# Patient Record
Sex: Male | Born: 1950 | Race: White | Hispanic: No | Marital: Married | Smoking: Former smoker
Health system: Southern US, Community
[De-identification: ages and names within clinical notes are randomized; demographics above are authoritative.]

## PROBLEM LIST (undated history)

## (undated) DIAGNOSIS — I493 Ventricular premature depolarization: Secondary | ICD-10-CM

## (undated) DIAGNOSIS — D682 Hereditary deficiency of other clotting factors: Secondary | ICD-10-CM

## (undated) DIAGNOSIS — I82409 Acute embolism and thrombosis of unspecified deep veins of unspecified lower extremity: Secondary | ICD-10-CM

## (undated) DIAGNOSIS — I4891 Unspecified atrial fibrillation: Secondary | ICD-10-CM

## (undated) DIAGNOSIS — K635 Polyp of colon: Secondary | ICD-10-CM

## (undated) DIAGNOSIS — R0602 Shortness of breath: Secondary | ICD-10-CM

## (undated) DIAGNOSIS — K5190 Ulcerative colitis, unspecified, without complications: Secondary | ICD-10-CM

## (undated) DIAGNOSIS — T4145XA Adverse effect of unspecified anesthetic, initial encounter: Secondary | ICD-10-CM

## (undated) DIAGNOSIS — A770 Spotted fever due to Rickettsia rickettsii: Secondary | ICD-10-CM

## (undated) DIAGNOSIS — K5792 Diverticulitis of intestine, part unspecified, without perforation or abscess without bleeding: Secondary | ICD-10-CM

## (undated) HISTORY — DX: Diverticulitis of intestine, part unspecified, without perforation or abscess without bleeding: K57.92

## (undated) HISTORY — PX: INGUINAL HERNIA REPAIR: SUR1180

## (undated) HISTORY — DX: Unspecified atrial fibrillation: I48.91

## (undated) HISTORY — DX: Hereditary deficiency of other clotting factors: D68.2

## (undated) HISTORY — DX: Polyp of colon: K63.5

## (undated) HISTORY — PX: CHOLECYSTECTOMY: SHX55

## (undated) HISTORY — DX: Ventricular premature depolarization: I49.3

## (undated) HISTORY — DX: Spotted fever due to Rickettsia rickettsii: A77.0

## (undated) HISTORY — PX: TONSILLECTOMY: SUR1361

## (undated) HISTORY — DX: Ulcerative colitis, unspecified, without complications: K51.90

## (undated) HISTORY — PX: DG GALL BLADDER: HXRAD326

## (undated) HISTORY — DX: Acute embolism and thrombosis of unspecified deep veins of unspecified lower extremity: I82.409

---

## 2003-05-08 HISTORY — PX: LUMBAR LAMINECTOMY: SHX95

## 2007-03-05 ENCOUNTER — Ambulatory Visit: Payer: Self-pay

## 2007-03-05 ENCOUNTER — Ambulatory Visit: Payer: Self-pay | Admitting: Internal Medicine

## 2007-07-15 ENCOUNTER — Inpatient Hospital Stay (HOSPITAL_COMMUNITY): Admission: AD | Admit: 2007-07-15 | Discharge: 2007-07-17 | Payer: Self-pay | Admitting: Internal Medicine

## 2007-07-15 ENCOUNTER — Ambulatory Visit: Payer: Self-pay | Admitting: Internal Medicine

## 2008-10-29 IMAGING — CT CT HEAD W/O CM
1 series · 16 of 30 positions shown, 20 images · IV contrast (agent unspecified)
Comparison: None.

CLINICAL DATA: Severe headache.
 HEAD CT WITHOUT CONTRAST:
TECHNIQUE: Contiguous axial images were obtained from the base of the skull through the vertex according to standard protocol without contrast.

[Series 2: head routine 4.8 h37s · axial · 0.44mm/px · z∈[+1063,+1218]mm · 16 of 36 slices shown, 20 images]
[im 2/36  brain]
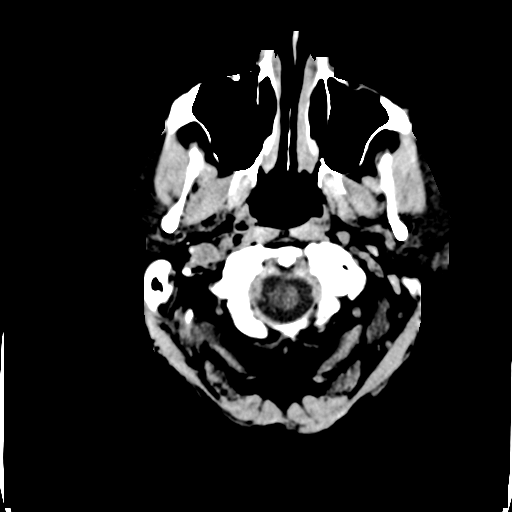
[im 2/36  bone]
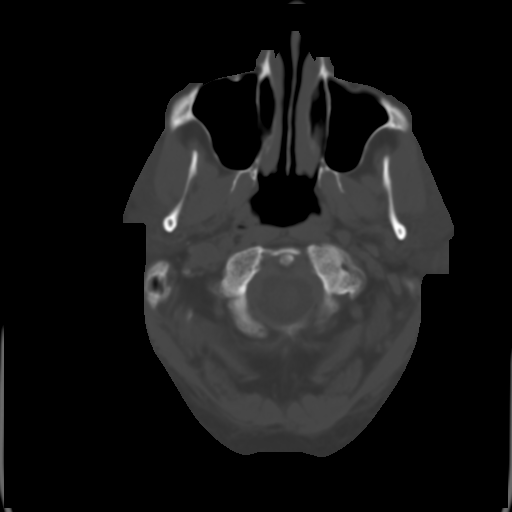
[im 4/36  brain]
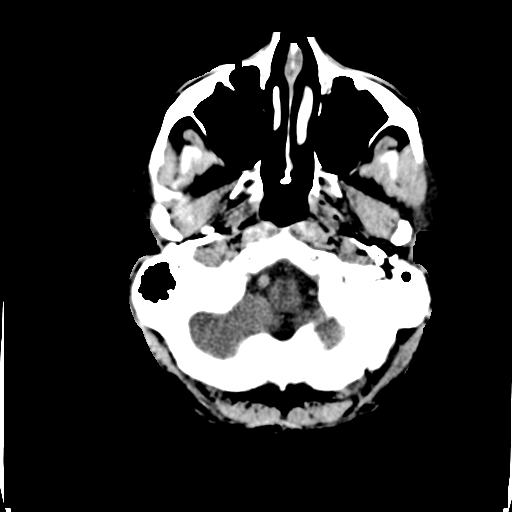
[im 7/36  brain]
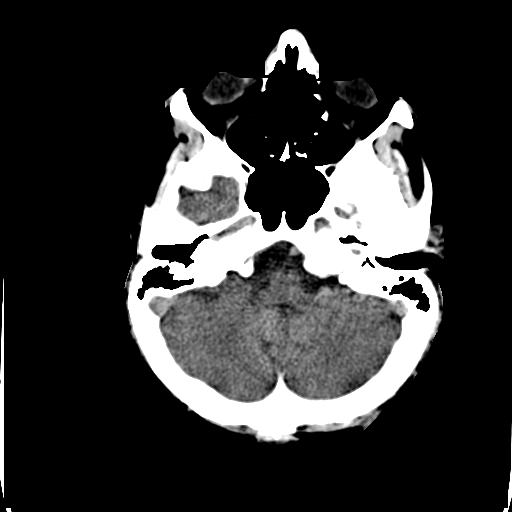
[im 9/36  brain]
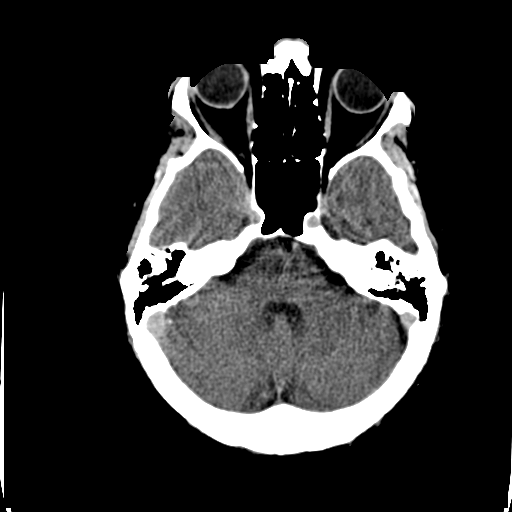
[im 10/36  brain]
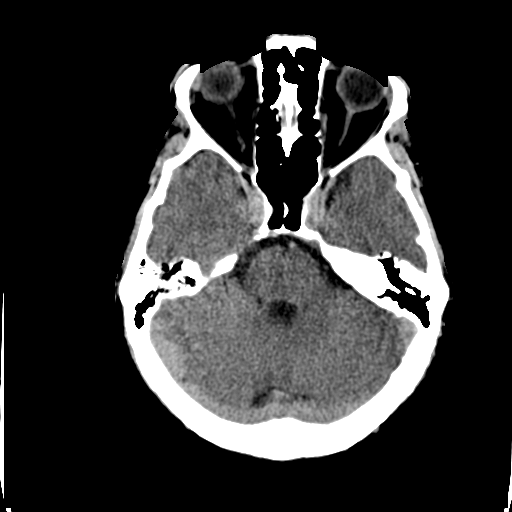
[im 10/36  bone]
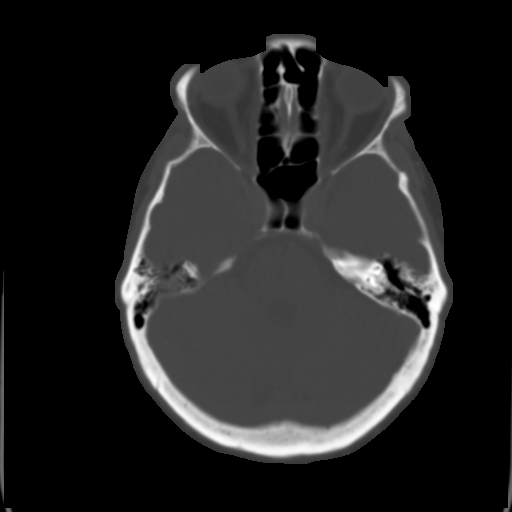
[im 13/36  brain]
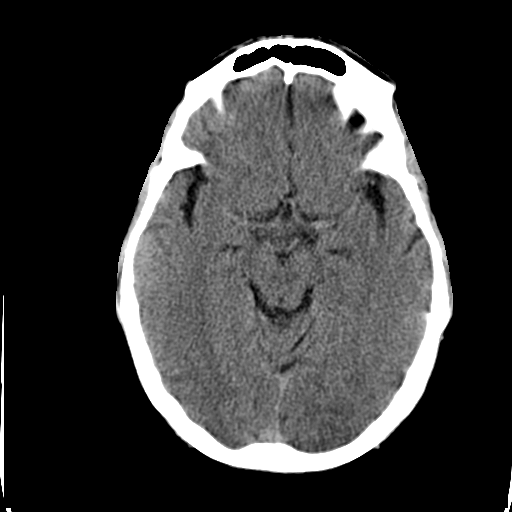
[im 15/36  brain]
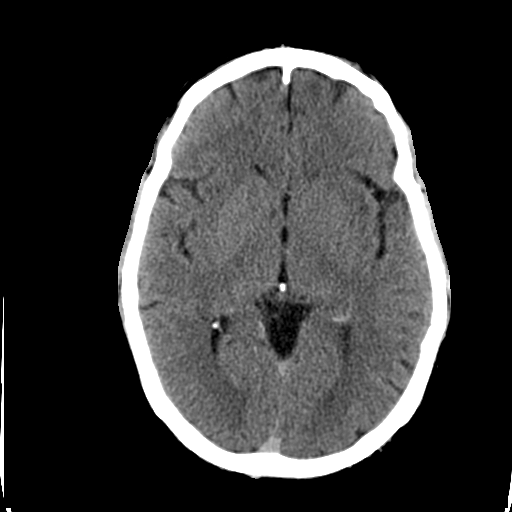
[im 17/36  brain]
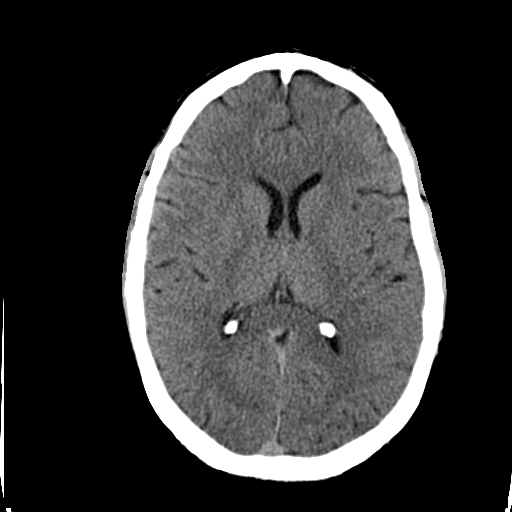
[im 19/36  brain]
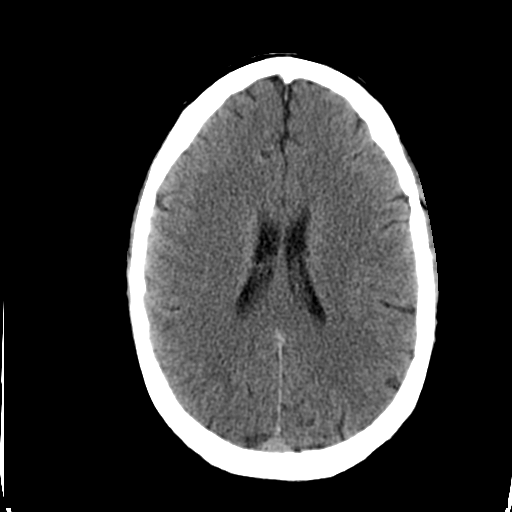
[im 19/36  bone]
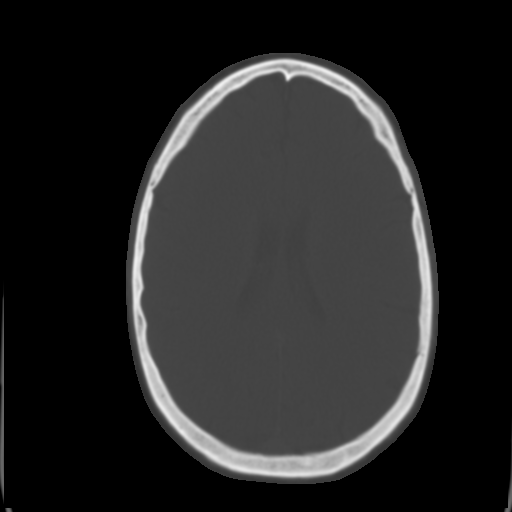
[im 21/36  brain]
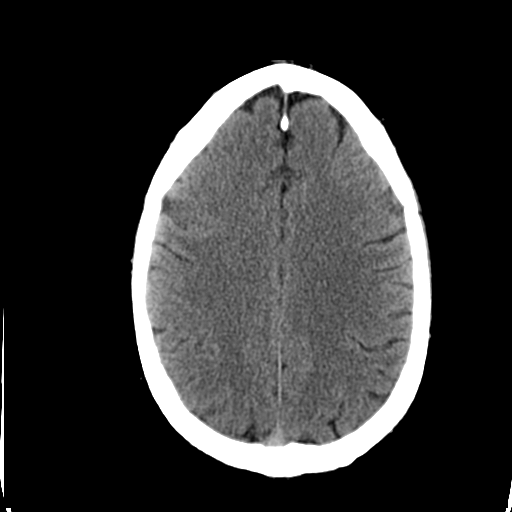
[im 23/36  brain]
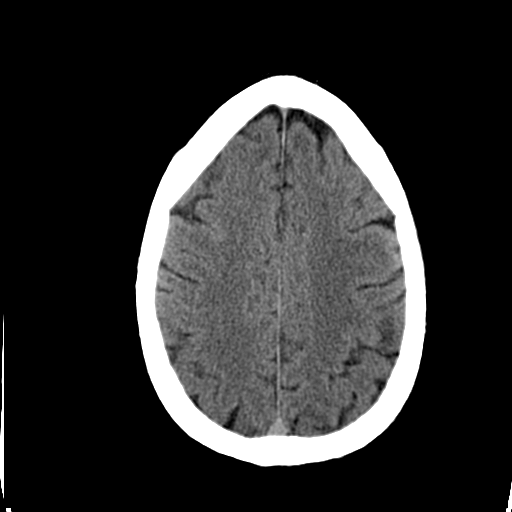
[im 26/36  brain]
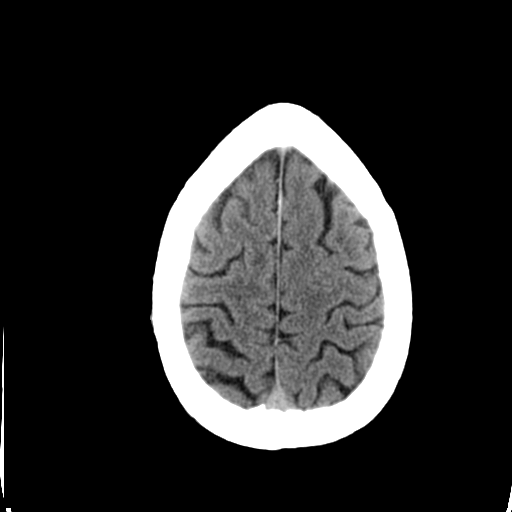
[im 27/36  brain]
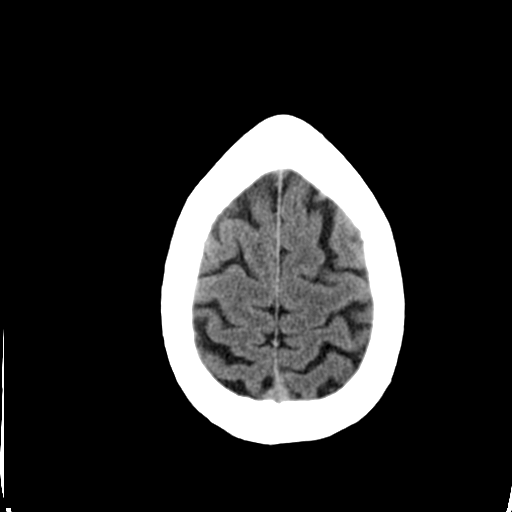
[im 27/36  bone]
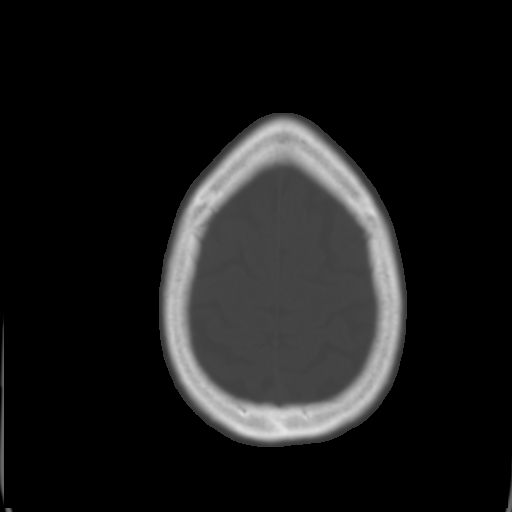
[im 29/36  brain]
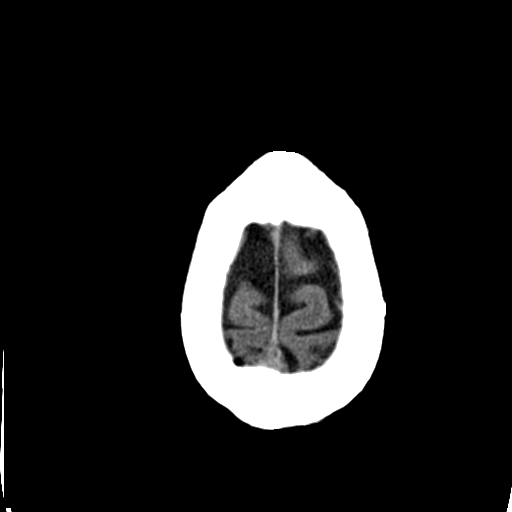
[im 32/36  brain]
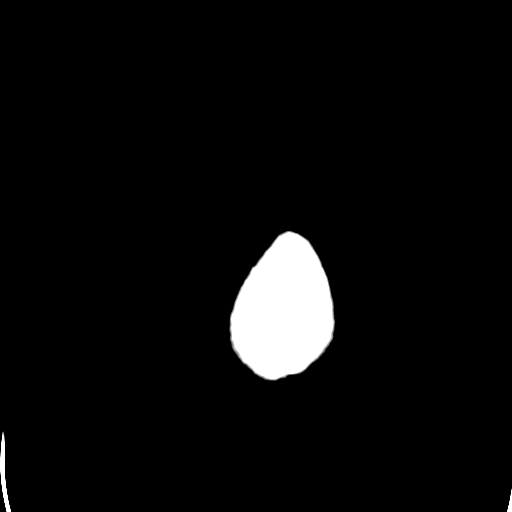
[im 34/36  brain]
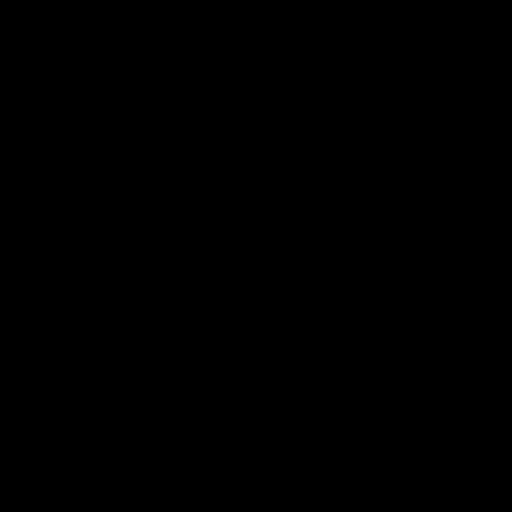

[16 of 30 positions shown; findings below may reference images not displayed]

FINDINGS: The brain has a normal appearance without evidence of atrophy, stroke, mass, hemorrhage, hydrocephalus, or extra-axial collection.  The calvarium is unremarkable.  The sinuses, middle ears, and mastoids are clear.
IMPRESSION: Normal head CT.

## 2009-02-22 ENCOUNTER — Ambulatory Visit: Payer: Self-pay | Admitting: Internal Medicine

## 2009-02-22 DIAGNOSIS — I493 Ventricular premature depolarization: Secondary | ICD-10-CM | POA: Insufficient documentation

## 2009-02-22 DIAGNOSIS — I4891 Unspecified atrial fibrillation: Secondary | ICD-10-CM

## 2009-02-22 DIAGNOSIS — R5383 Other fatigue: Secondary | ICD-10-CM

## 2009-02-22 DIAGNOSIS — R5381 Other malaise: Secondary | ICD-10-CM

## 2009-06-02 ENCOUNTER — Telehealth: Payer: Self-pay | Admitting: Internal Medicine

## 2009-08-02 ENCOUNTER — Ambulatory Visit: Payer: Self-pay | Admitting: Internal Medicine

## 2009-08-02 DIAGNOSIS — R079 Chest pain, unspecified: Secondary | ICD-10-CM

## 2009-08-10 ENCOUNTER — Telehealth (INDEPENDENT_AMBULATORY_CARE_PROVIDER_SITE_OTHER): Payer: Self-pay | Admitting: *Deleted

## 2009-08-11 ENCOUNTER — Encounter (HOSPITAL_COMMUNITY): Admission: RE | Admit: 2009-08-11 | Discharge: 2009-10-05 | Payer: Self-pay | Admitting: Internal Medicine

## 2009-08-11 ENCOUNTER — Ambulatory Visit: Payer: Self-pay

## 2009-08-11 ENCOUNTER — Ambulatory Visit: Payer: Self-pay | Admitting: Cardiology

## 2009-08-15 ENCOUNTER — Telehealth: Payer: Self-pay | Admitting: Internal Medicine

## 2009-08-16 ENCOUNTER — Telehealth: Payer: Self-pay | Admitting: Internal Medicine

## 2009-09-14 ENCOUNTER — Encounter: Payer: Self-pay | Admitting: Internal Medicine

## 2009-09-27 ENCOUNTER — Telehealth: Payer: Self-pay | Admitting: Internal Medicine

## 2010-02-22 ENCOUNTER — Encounter: Payer: Self-pay | Admitting: Internal Medicine

## 2010-02-22 ENCOUNTER — Ambulatory Visit: Payer: Self-pay | Admitting: Internal Medicine

## 2010-03-15 ENCOUNTER — Telehealth: Payer: Self-pay | Admitting: Internal Medicine

## 2010-04-20 ENCOUNTER — Telehealth: Payer: Self-pay | Admitting: Internal Medicine

## 2010-05-03 ENCOUNTER — Encounter (INDEPENDENT_AMBULATORY_CARE_PROVIDER_SITE_OTHER): Payer: Self-pay | Admitting: *Deleted

## 2010-05-10 ENCOUNTER — Telehealth: Payer: Self-pay | Admitting: Internal Medicine

## 2010-06-06 NOTE — Progress Notes (Signed)
Summary: Questions about mdication making him feel funny  Phone Note Call from Patient Call back at [phone]   Caller: Patient Summary of Call: Pt calling regarding the medication and Diltiazem 120 mg it is making the pt feel funny in his head pt want to know if he can go back to taking Metoprolol 50mg . Initial call taken by: Judie Grieve,  August 16, 2009 2:49 PM  Follow-up for Phone Call        Called patient and left message on machine Gypsy Balsam RN BSN  August 16, 2009 4:38 PM  Spoke with patient.  He is not tolerating Cardizem because of symptoms of dizziness and head pressure.  He will stop this med and try Atenolol instead.  Symptoms of sleep apnea do seem to be better of Metoprolol.  Pt will call back in 2 weeks with progress. Gypsy Balsam RN BSN  August 16, 2009 5:03 PM
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
## 2010-06-06 NOTE — Progress Notes (Signed)
Summary: refill meds  Phone Note Refill Request Call back at Home Phone [phone] Message from:  Patient on June 02, 2009 10:12 AM  Refills Requested: Medication #1:  METOPROLOL SUCCINATE 50 MG XR24H-TAB once daily [facility] [place] [phone]   Method Requested: Fax to Mail Away Pharmacy Initial call taken by: Lorne Skeens,  June 02, 2009 10:12 AM  Follow-up for Phone Call       Follow-up by: Judithe Modest CMA,  June 02, 2009 11:21 AM    Prescriptions: METOPROLOL SUCCINATE 50 MG XR24H-TAB (METOPROLOL SUCCINATE) once daily  #30 x 6   Entered by:   Judithe Modest CMA   Authorized by:   Nathen May, MD, [facility]   Signed by:   Judithe Modest CMA on 06/02/2009   Method used:   Electronically to        [facility] [id number] (retail)       [address]       [place], [place]  [address]       Ph: [phone]       Fax: [phone]   RxID:   [id number]

## 2010-06-06 NOTE — Progress Notes (Signed)
Summary: myoview results  Phone Note Outgoing Call Call back at [facility] Phone [phone]   Call placed by: Gypsy Balsam RN BSN,  August 15, 2009 3:07 PM Summary of Call: No answer- attempted to call patient regarding myoview results and to see how symptoms of sleep apnea are with med trials. Gypsy Balsam RN BSN  August 15, 2009 3:08 PM   Follow-up for Phone Call        Pt notified. Gypsy Balsam RN BSN  August 16, 2009 4:49 PM

## 2010-06-06 NOTE — Progress Notes (Signed)
Summary: refill  Phone Note Refill Request Message from:  Patient on Sep 27, 2009 10:23 AM  Refills Requested: Medication #1:  Atenolol 50mg  Sent to [facility] [phone]  Initial call taken by: Judie Grieve,  Sep 27, 2009 10:24 AM  Follow-up for Phone Call       Follow-up by: Judithe Modest CMA,  Sep 27, 2009 11:14 AM    New/Updated Medications: ATENOLOL 50 MG TABS (ATENOLOL) once daily [facility] Prescriptions: ATENOLOL 50 MG TABS (ATENOLOL) once daily [facility]  #30 x 0   Entered by:   Judithe Modest CMA   Authorized by:   Nathen May, MD, [facility]   Signed by:   Judithe Modest CMA on 09/27/2009   Method used:   Electronically to        [facility] [id number] (retail)       [address]       [place], [place]  [address]       Ph: [phone]       Fax: [phone]   RxID:   [id number]

## 2010-06-06 NOTE — Progress Notes (Addendum)
Summary: problem with meds  Phone Note Call from Patient   Caller: Patient [phone] or [phone] Reason for Call: Talk to Nurse Summary of Call: pt was on biostolic and it made his legs hurt, had no energy. so he went back on atenolol and it's making his eyes itchy and red, can he cahnge to something else? uses [facility] [place] Initial call taken by: Glynda Jaeger,  March 15, 2010 9:57 AM  Follow-up for Phone Call        per pt Biostolic made him feel better but legs too heavy, as soon as got off w/in 2 days felt better. The atenolol made his eyes crusty and red and throat hard to swallow. Will take his metoprolol today and then have Dr. Graciela Husbands review.  Follow-up by: Claris Gladden RN,  March 15, 2010 11:10 AM  Additional Follow-up for Phone Call Additional follow up Details #1::        sent in prescription for Pindolol 5mg  per DOD but unable to reach pt. Claris Gladden, RN, BSN 11/9 [phone] adv pt that prescription called in.   Additional Follow-up by: Claris Gladden RN,  March 15, 2010 2:48 PM    New/Updated Medications: PINDOLOL 5 MG TABS (PINDOLOL) once daily Prescriptions: PINDOLOL 5 MG TABS (PINDOLOL) once daily  #30 x 3   Entered by:   Claris Gladden RN   Authorized by:   Nathen May, MD, [facility]   Signed by:   Claris Gladden RN on 03/15/2010   Method used:   Electronically to        [facility] [id number] (retail)       [address]       [place], [place]  [address]       Ph: [phone]       Fax: [phone]   RxID:   [id number]    Allergies: 1)  ! Sulfa   Appended Document: problem with meds d/c pindolol   send RX for  inderal la 60; bisoprolol 2.5, nadolol 20  Appended Document: problem with meds adv pt not to take Pindolol and he will take Metoprolol until he can try the other meds. Claris Gladden, RN, BSN

## 2010-06-06 NOTE — Progress Notes (Signed)
Summary: Nuclear Pre-Procedure  Phone Note Outgoing Call Call back at [facility] Phone [phone]   Call placed by: Stanton Kidney, EMT-P,  August 10, 2009 3:01 PM Call placed to: Patient Action Taken: Phone Call Completed Summary of Call: Reviewed information on Myoview Information Sheet (see scanned document for further details).  Spoke with Patient.    Nuclear Med Background Indications for Stress Test: Evaluation for Ischemia   History: Abnormal EKG, Echo  History Comments: 10/08 Echo: EF=55-60%  Symptoms: Chest Tightness, Fatigue, Palpitations    Nuclear Pre-Procedure Cardiac Risk Factors: Family History - CAD Height (in): 72

## 2010-06-06 NOTE — Assessment & Plan Note (Signed)
Summary: Update med

## 2010-06-06 NOTE — Assessment & Plan Note (Signed)
Summary: PER CHECK OUT/SF   Visit Type:  Follow-up Primary Andera Cranmer:  Dr Ron Parker  CC:  irregular heart beat.  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  Last night he had a disrupted night.  He has not tolerated beta blockers well in the past. Inderal has been sleepy. Metoprolol and wall were both significant fatigue and other side effects. He is currently taking atenolol and he's down titrated his dose from 50-25 with some improvement in his overall fatigue.         Current Medications (verified): 1)  Aspirin 81 Mg Tabs (Aspirin) .Marland Kitchen.. 1 Tab Once Daily 2)  Atenolol 50 Mg Tabs (Atenolol) .... 1/2  Tab  Daily  Allergies (verified): 1)  ! Sulfa  Past History:  Past Medical History: Last updated: 02/22/2009 DVT, lower left leg Diverticulitis ? RMSF Question sepsis associated with diverticulitis  Vital Signs:  Patient profile:   60 year old male Height:      72 inches Weight:      196 pounds Pulse rate:   70 / minute BP sitting:   123 / 77  (left arm) Cuff size:   large  Vitals Entered By: Frank Kanaris, CNA (February 22, 2010 11:11 AM)  Physical Exam  General:  The patient was alert and oriented in no acute distress. HEENT Normal.  Neck veins were flat, carotids were brisk.  Lungs were clear.  Heart sounds were regular without murmurs or gallops.  Abdomen was soft with active bowel sounds. There is no clubbing cyanosis or edema. Skin Warm and dry    Impression & Recommendations:  Problem # 1:  ATRIAL FIBRILLATION (ICD-427.31)  There  has been no recurrent atrial fibrillation; the PACs are certainly a concern. We'll check her thyroid today. The following medications were removed from the medication list:    Atenolol 50 Mg Tabs (Atenolol) ..... Once daily uad His updated medication list for this problem includes:    Aspirin 81 Mg Tabs (Aspirin) .Marland Kitchen... 1 tab once daily    Bystolic 5 Mg Tabs  (Nebivolol hcl) ..... Once 1/2 pill daily  Orders: TLB-TSH (Thyroid Stimulating Hormone) (84443-TSH)  Problem # 2:  FATIGUE (ICD-780.79) this may be related to his atenolol. We will plan to try him on diastolic samples of which he received from Dr. Clarene Duke. He will let us know Orders: TLB-TSH (Thyroid Stimulating Hormone) (84443-TSH)  Problem # 3:  PREMATURE VENTRICULAR CONTRACTIONS (ICD-427.69) his palpitations last night may well have been PVCs again. He thinks that they were different. The following medications were removed from the medication list:    Atenolol 50 Mg Tabs (Atenolol) ..... Once daily uad His updated medication list for this problem includes:    Aspirin 81 Mg Tabs (Aspirin) .Marland Kitchen... 1 tab once daily    Bystolic 5 Mg Tabs (Nebivolol hcl) ..... Once 1/2 pill daily  Patient Instructions: 1)  Your physician has recommended you make the following change in your medication: Discontinue Atenolol and start Bystolic samples that you have.  2)  Your physician recommends that you have TSH lab work today.  3)  Your physician wants you to follow-up in: 6 months   You will receive a reminder letter in the mail two months in advance. If you don't receive a letter, please call our office to schedule the follow-up appointment.

## 2010-06-06 NOTE — Assessment & Plan Note (Signed)
Summary: Cardiology Nuclear Study  Nuclear Med Background Indications for Stress Test: Evaluation for Ischemia   History: Abnormal EKG, Echo  History Comments: 10/08 Echo: EF=55-60%  Symptoms: Chest Tightness, Fatigue, Palpitations, SOB    Nuclear Pre-Procedure Cardiac Risk Factors: Family History - CAD Caffeine/Decaff Intake: None NPO After: 6:00 PM Lungs: clear IV 0.9% NS with Angio Cath: 22g     IV Site: (R) AC IV Started by: Irean Hong RN Chest Size (in) 44     Height (in): 72 Weight (lb): 195 BMI: 26.54  Nuclear Med Study 1 or 2 day study:  1 day     Stress Test Type:  Eugenie Birks Reading MD:  Marca Ancona, MD     Referring MD:  S.Klein Resting Radionuclide:  Technetium 28m Tetrofosmin     Resting Radionuclide Dose:  11 mCi  Stress Radionuclide:  Technetium 83m Tetrofosmin     Stress Radionuclide Dose:  33 mCi   Stress Protocol   Lexiscan: 0.4 mg   Stress Test Technologist:  Milana Na EMT-P     Nuclear Technologist:  Domenic Polite CNMT  Rest Procedure  Myocardial perfusion imaging was performed at rest 45 minutes following the intravenous administration of Myoview Technetium 46m Tetrofosmin.  Stress Procedure  The patient received IV Lexiscan 0.4 mg over 15-seconds.  Myoview injected at 30-seconds.  There were no significant changes with infusion.  Quantitative spect images were obtained after a 45 minute delay.  QPS Raw Data Images:  Normal; no motion artifact; normal heart/lung ratio. Stress Images:  NI: Uniform and normal uptake of tracer in all myocardial segments. Rest Images:  Uniform and normal uptake of tracer in all myocardial segments. Subtraction (SDS):  There is no evidence of scar or ischemia. Transient Ischemic Dilatation:  101  (Normal <1.22)  Lung/Heart Ratio:  .25  (Normal <0.45)  Quantitative Gated Spect Images QGS EDV:  92 ml QGS ESV:  33 ml QGS EF:  64 % QGS cine images:  Normal wall motion.    Overall  Impression  Exercise Capacity: Lexiscan study.  BP Response: Normal blood pressure response. Clinical Symptoms: Felt "weird" ECG Impression: No significant ST segment change suggestive of ischemia. Overall Impression: Normal stress nuclear study.  Appended Document: Cardiology Nuclear Study plz inform patinent

## 2010-06-08 NOTE — Letter (Signed)
Summary: Generic Letter  Architectural technologist, Main Office  [address]   [place], [place] [address]   Phone: [phone]  Fax: [phone]        May 03, 2010 MRN: [id number]    [facility] [address] [place], [place]  [address]    Dear Frank Clark,  I have been trying to contact you regarding your refill request for Atenolol and Propranolol. It was our understanding that you had discontinued Atenolol and were trying Propranolol as one of the beta blocker options.  These medications were not prescribed to be taken together.    Please call our office to discuss which blood pressure medication you are currently taking on a regular basis.      Sincerely,  Frank Gladden RN  This letter has been electronically signed by your physician.

## 2010-06-08 NOTE — Progress Notes (Signed)
Summary: refill meds  Phone Note Refill Request Call back at Home Phone [phone] Message from:  Patient on April 20, 2010 12:13 PM  inderal la 60 mg. [facility] [place]. [phone].   Method Requested: Fax to Mail Away Pharmacy Initial call taken by: Lorne Skeens,  April 20, 2010 12:14 PM  Follow-up for Phone Call        left msg for pt to cb.Claris Gladden, RN, BSN 12/20 1334 rna at pt home number. pt was given prescrip of different beta blockers to try. see that he is taking 2 beta blockers.  will discuss w/pt when he calls back. Follow-up by: Claris Gladden RN,  April 25, 2010 5:09 PM  Additional Follow-up for Phone Call Additional follow up Details #1::        rna at pt home number. Claris Gladden, RN, BSN 12/21 [phone] 12/22 1855 rna at pt house. Claris Gladden, RN, BSN 12/28 [phone] line busy. Claris Gladden, RN, BSN 12/28 12:00 pm line busy. Claris Gladden, RN, BSN 12/28 1325 pm line busy. Claris Gladden, RN, BSN letter sent to pt for clarification. Claris Gladden, RN, BSN Additional Follow-up by: Claris Gladden RN,  May 03, 2010 1:35 PM

## 2010-06-08 NOTE — Progress Notes (Signed)
Summary: rx refill PROPRANOLOL ER 60MG  CAP  Phone Note Call from Patient Call back at Home Phone [phone]   Caller: Patient Reason for Call: Talk to Nurse Summary of Call: pt has question re PROPRANOLOL ER 60MG  CAP. pt needs this to be calle din to [facility] [phone] Initial call taken by: Roe Coombs,  May 10, 2010 3:00 PM  Follow-up for Phone Call        rna pt house. prescription called in. Claris Gladden, RN, BSN 05/11/10 1002 05/11/10 1225 rna Claris Gladden, RN, BSN    New/Updated Medications: PROPRANOLOL HCL CR 60 MG XR24H-CAP (PROPRANOLOL HCL) once daily Prescriptions: PROPRANOLOL HCL CR 60 MG XR24H-CAP (PROPRANOLOL HCL) once daily  #90 x 3   Entered by:   Claris Gladden RN   Authorized by:   Nathen May, MD, [facility]   Signed by:   Claris Gladden RN on 05/11/2010   Method used:   Electronically to        [facility] [id number] (retail)       [address]       [place], [place]  [address]       Ph: [phone]       Fax: [phone]   RxID:   [id number]
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
## 2010-09-28 ENCOUNTER — Encounter: Payer: Self-pay | Admitting: Internal Medicine

## 2010-10-09 ENCOUNTER — Encounter: Payer: Self-pay | Admitting: Internal Medicine

## 2010-10-09 ENCOUNTER — Ambulatory Visit (INDEPENDENT_AMBULATORY_CARE_PROVIDER_SITE_OTHER): Payer: Self-pay | Admitting: Internal Medicine

## 2010-10-09 DIAGNOSIS — I472 Ventricular tachycardia: Secondary | ICD-10-CM

## 2010-10-09 DIAGNOSIS — I428 Other cardiomyopathies: Secondary | ICD-10-CM

## 2010-10-09 NOTE — Patient Instructions (Signed)
Your physician has recommended you make the following change in your medication:  1) Hold propranolol (inderal) 2) Take a 1/2 a tablet a day of the bystolic dose (samples) that you have at home.  Your physician wants you to follow-up in: 4 months. You will receive a reminder letter in the mail two months in advance. If you don't receive a letter, please call our office to schedule the follow-up appointment.  Please call Dr. Odessa Fleming nurse, Sherri Rad, in 4 weeks to let us know how you are feeling off the propranolol and on the bystolic.

## 2010-10-09 NOTE — Progress Notes (Signed)
See dictated note.

## 2010-10-09 NOTE — Assessment & Plan Note (Signed)
[facility]                        ELECTROPHYSIOLOGY OFFICE NOTE  Frank Clark, Frank Clark                      MRN:          [id number] DATE:10/09/2010                            DOB:          September 03, 1950   The patient was seen in followup for paroxysmal atrial fibrillation and PVCs.  We have tried him on a variety of beta-blockers.  He is currently taking Inderal.  This has had a moderately successful impact on the palpitations.  It is associated with some fatigue although less than he had been with other medications.  He finds that it slowed his metabolism and that he is gaining weight.  He is walking 3 miles a day.  He has had some problem with chest discomfort also, this is a vague discomfort and is not exertional.  It comes episodically.  It can last 5- 30 minutes at a time.  It is not aggravated by his walking and it sometimes relieves even while he continues his walking.  PHYSICAL EXAMINATION:  His blood pressure today was 120/76, his pulse was 70.  His lungs were clear.  Neck veins were flat.  Heart sounds were regular without murmurs or gallops.  The abdomen was soft.  Extremities were without edema.  His skin was warm and dry.  Electrocardiogram dated today demonstrated sinus rhythm at 70 with intervals of 0.14/0.08/0.37.  The axis was leftward and -30.  There is a suggestion of inferior Q-waves.  IMPRESSION: 1. Fatigue, questionably related to his beta-blocker. 2. Premature ventricular contractions. 3. Paroxysmal atrial fibrillation. 4. Chest pain - atypical.  We will plan to try him on his Bystolic for which he has samples at home.  He will let us know over the next 3 or 4 weeks how it is that he is feeling with this.  I think his chest pain is sufficiently atypical that pursuing further at this point, it is not something we will do.  I will need to check his old chart to see if he has had a stress test in the past which I think he  has, but unfortunately right now I am not able to get into the computer system.    Duke Salvia, MD, [facility]    SCK/MedQ  DD: 10/09/2010  DT: 10/09/2010  Job #: [id number]  cc:   Kari Baars, M.D.

## 2011-01-29 LAB — PROTIME-INR
INR: 0.9
INR: 1

## 2011-01-29 LAB — ANTIPHOSPHOLIPID SYNDROME EVAL, BLD
Anticardiolipin IgA: <7 — ABNORMAL LOW (ref <13)
Anticardiolipin IgG: 9 — ABNORMAL LOW (ref <11)
Anticardiolipin IgM: <7 — ABNORMAL LOW (ref <10)
Antiphosphatidylserine IgA: <20 APS U/mL (ref <20.0)
Antiphosphatidylserine IgM: <25 MPS U/mL (ref <25.0)
PTT Lupus Anticoagulant: 42 (ref 36.3–48.8)

## 2011-01-29 LAB — COMPREHENSIVE METABOLIC PANEL
ALT: 46
AST: 24
Alkaline Phosphatase: 75
CO2: 26
Chloride: 105
GFR calc Af Amer: >60
GFR calc non Af Amer: >60
Glucose, Bld: 95
Potassium: 3.7
Sodium: 140

## 2011-01-29 LAB — CBC
HCT: 44.8
HCT: 50.2
Hemoglobin: 15.6
Hemoglobin: 15.8
MCHC: 34.8
MCV: 90.4
Platelets: 163
RBC: 4.97
RBC: 5.12
RDW: 12.4
RDW: 12.5
WBC: 10.1
WBC: 11.3 — ABNORMAL HIGH

## 2011-01-29 LAB — FACTOR 5 LEIDEN

## 2011-01-29 LAB — CARDIOLIPIN ANTIBODIES, IGG, IGM, IGA
Anticardiolipin IgA: <7 — ABNORMAL LOW (ref <13)
Anticardiolipin IgG: 7 — ABNORMAL LOW (ref <11)

## 2011-01-29 LAB — LUPUS ANTICOAGULANT PANEL: PTTLA Confirmation: 2.4 (ref <8.0)

## 2011-01-29 LAB — BETA-2-GLYCOPROTEIN I ABS, IGG/M/A
Beta-2 Glyco I IgG: 5 U/mL (ref <20)
Beta-2-Glycoprotein I IgA: <4 U/mL (ref <10)

## 2011-01-29 LAB — PROTEIN C ACTIVITY: Protein C Activity: 152 % — ABNORMAL HIGH (ref 75–133)

## 2011-01-29 LAB — PROTEIN S, TOTAL: Protein S Ag, Total: 176 % — ABNORMAL HIGH (ref 70–140)

## 2011-01-29 LAB — PROTEIN C, TOTAL: Protein C, Total: 105 % (ref 70–140)

## 2011-01-29 LAB — PROTHROMBIN GENE MUTATION

## 2011-01-29 LAB — ANTITHROMBIN III: AntiThromb III Func: 121 — ABNORMAL HIGH (ref 76–126)

## 2011-02-08 ENCOUNTER — Ambulatory Visit (INDEPENDENT_AMBULATORY_CARE_PROVIDER_SITE_OTHER): Payer: BC Managed Care – PPO | Admitting: Internal Medicine

## 2011-02-08 ENCOUNTER — Encounter: Payer: Self-pay | Admitting: Internal Medicine

## 2011-02-08 DIAGNOSIS — R079 Chest pain, unspecified: Secondary | ICD-10-CM

## 2011-02-08 DIAGNOSIS — D6851 Activated protein C resistance: Secondary | ICD-10-CM | POA: Insufficient documentation

## 2011-02-08 DIAGNOSIS — D6859 Other primary thrombophilia: Secondary | ICD-10-CM

## 2011-02-08 DIAGNOSIS — I4949 Other premature depolarization: Secondary | ICD-10-CM

## 2011-02-08 MED ORDER — NEBIVOLOL HCL 5 MG PO TABS: 5.0000 mg | ORAL_TABLET | Freq: Every day | ORAL | Status: DC

## 2011-02-08 NOTE — Assessment & Plan Note (Signed)
Currently controlled  - continue current medications.  

## 2011-02-08 NOTE — Progress Notes (Signed)
  HPI  Frank Clark is a 60 y.o. male for paroxysmal atrial fibrillation and  PVCs. We have tried him on a variety of beta-blockers. He is currently taking systolic which has been the best for him. He's had just infrequent PVCs. There is not severe lethargy on the 5 mg dosing.  He continues with some atypical chest pains. Myoview scan in April 2011 with normal.  He tells me he has factor V Leiden deficiency. He has a history of a clot in his leg. He was told that he could stop his anticoagulation after 6 months.  He is scheduled for gallbladder surgery. He should be acceptable risk he should take his Bystolic  Past Medical History  Diagnosis Date  . DVT, lower extremity     left  . Diverticulitis   . RMSF [facility] spotted fever)     Past Surgical History  Procedure Date  . Lumbar laminectomy 2005  . Inguinal hernia repair     left  . Tonsillectomy     Current Outpatient Prescriptions  Medication Sig Dispense Refill  . aspirin 81 MG tablet Take 81 mg by mouth daily.        . nebivolol (BYSTOLIC) 5 MG tablet Take 5 mg by mouth daily.        Marland Kitchen omeprazole (PRILOSEC) 10 MG capsule Take 10 mg by mouth as needed.        . traMADol (ULTRAM) 50 MG tablet         Allergies  Allergen Reactions  . Sulfonamide Derivatives     REACTION: Reaction not known    Review of Systems negative except from HPI and PMH  Physical Exam Well developed and well nourished in no acute distress HENT normal E scleral and icterus clear Neck Supple Clear to ausculation Regular rate and rhythm, no murmurs gallops or rub Soft with active bowel sounds No clubbing cyanosis and edema Alert and oriented, grossly normal motor and sensory function Skin Warm and Dry   Assessment and  Plan

## 2011-02-08 NOTE — Patient Instructions (Signed)
Your physician wants you to follow-up in:  YEAR WITH DR KLEIN  You will receive a reminder letter in the mail two months in advance. If you don't receive a letter, please call our office to schedule the follow-up appointment. Your physician recommends that you continue on your current medications as directed. Please refer to the Current Medication list given to you today. 

## 2011-02-08 NOTE — Assessment & Plan Note (Signed)
Probably noncardiac with negative Myoview year ago

## 2011-02-08 NOTE — Assessment & Plan Note (Signed)
I will review the recommendations for long-term anticoagulation in a patient with a known deficiency and prior clot

## 2011-02-24 DIAGNOSIS — T8859XA Other complications of anesthesia, initial encounter: Secondary | ICD-10-CM

## 2011-02-24 HISTORY — DX: Other complications of anesthesia, initial encounter: T88.59XA

## 2011-04-19 ENCOUNTER — Ambulatory Visit (INDEPENDENT_AMBULATORY_CARE_PROVIDER_SITE_OTHER): Payer: BC Managed Care – PPO | Admitting: Internal Medicine

## 2011-04-19 ENCOUNTER — Telehealth: Payer: Self-pay | Admitting: Internal Medicine

## 2011-04-19 ENCOUNTER — Encounter: Payer: Self-pay | Admitting: *Deleted

## 2011-04-19 DIAGNOSIS — I4891 Unspecified atrial fibrillation: Secondary | ICD-10-CM

## 2011-04-19 DIAGNOSIS — Z01810 Encounter for preprocedural cardiovascular examination: Secondary | ICD-10-CM

## 2011-04-19 LAB — CBC WITH DIFFERENTIAL/PLATELET
Eosinophils Relative: 0.3 % (ref 0.0–5.0)
MCV: 93.3 fl (ref 78.0–100.0)
Monocytes Absolute: 1.3 10*3/uL — ABNORMAL HIGH (ref 0.1–1.0)
Neutrophils Relative %: 80.2 % — ABNORMAL HIGH (ref 43.0–77.0)
Platelets: 215 10*3/uL (ref 150.0–400.0)
WBC: 17.4 10*3/uL — ABNORMAL HIGH (ref 4.5–10.5)

## 2011-04-19 LAB — BASIC METABOLIC PANEL
Calcium: 9 mg/dL (ref 8.4–10.5)
Chloride: 102 mEq/L (ref 96–112)
Creatinine, Ser: 1.2 mg/dL (ref 0.4–1.5)
GFR: 66.46 mL/min (ref >60.00)

## 2011-04-19 LAB — PROTIME-INR
INR: 1 ratio (ref 0.8–1.0)
Prothrombin Time: 10.8 s (ref 10.2–12.4)

## 2011-04-19 MED ORDER — RIVAROXABAN 20 MG PO TABS: 20.0000 mg | ORAL_TABLET | Freq: Every day | ORAL | Status: DC

## 2011-04-19 MED ORDER — NEBIVOLOL HCL 5 MG PO TABS: 5.0000 mg | ORAL_TABLET | Freq: Two times a day (BID) | ORAL | Status: DC

## 2011-04-19 NOTE — Telephone Encounter (Signed)
Per spouse pt heart is out of rhythm and he needs to be seen today and pt is having chest pain with exertion and she is very concerned

## 2011-04-19 NOTE — Patient Instructions (Signed)
Your physician has recommended you make the following change in your medication: STOP ASPIRIN START XARELTO 20 MG 1 EVERY DAY AND INCREASE BYSTOLIC TO 5 MG  TWICE DAILY  Your physician has requested that you have a TEE/Cardioversion. During a TEE, sound waves are used to create images of your heart. It provides your doctor with information about the size and shape of your heart and how well your heart's chambers and valves are working. In this test, a transducer is attached to the end of a flexible tube that is guided down you throat and into your esophagus (the tube leading from your mouth to your stomach) to get a more detailed image of your heart. Once the TEE has determined that a blood clot is not present, the cardioversion begins. Electrical Cardioversion uses a jolt of electricity to your heart either through paddles or wired patches attached to your chest. This is a controlled, usually prescheduled, procedure. This procedure is done at the hospital and you are not awake during the procedure. You usually go home the day of the procedure. Please see the instruction sheet given to you today for more information. MON 04/23/11  WITH DR [facility]

## 2011-04-19 NOTE — Progress Notes (Signed)
  HPI  Frank Clark is a 60 y.o. male Seen at the request of his wife because of shortness of breath and dyspnea on exertion. About 2 weeks ago he developed a sinus infection with drainage and a sore throat. He noted earlier this week that he had dyspnea on exertion and he felt" like his PVCs" were going crazy.  He has also been quite fatigued.  He has a history of paroxysmal atrial fibrillation and PVCs. We have tried him on a variety of beta-blockers. He is currently taking systolic which has been the best for him. He's had just infrequent PVCs. There is not severe lethargy on the 5 mg dosing.   Myoview scan in April 2011 with normal.  He tells me he has factor V Leiden deficiency. He has a history of a clot in his leg. He was told that he could stop his anticoagulation after 6 months.   Past Medical History  Diagnosis Date  . DVT, lower extremity     left  . Diverticulitis   . 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 PVC (premature ventricular contraction)   . Atrial fibrillation     assoc with diverticulitis and RMSF  . Factor V deficiency     Past Surgical History  Procedure Date  . Lumbar laminectomy 2005  . Inguinal hernia repair     left  . Tonsillectomy     Current Outpatient Prescriptions  Medication Sig Dispense Refill  . aspirin 81 MG tablet Take 81 mg by mouth daily.        . nebivolol (BYSTOLIC) 5 MG tablet Take 1 tablet (5 mg total) by mouth daily.  30 tablet  11  . omeprazole (PRILOSEC) 10 MG capsule Take 10 mg by mouth as needed.        . traMADol (ULTRAM) 50 MG tablet         Allergies  Allergen Reactions  . Sulfonamide Derivatives     REACTION: Reaction not known    Review of Systems negative except from HPI and PMH  Physical Exam Well developed and well nourished in no acute distress HENT normal E scleral and icterus clear Neck Supple JVP flat; carotids brisk and full Clear to ausculation irregularly irregular rate and rhythm, 2/6 early  systolic murmurs Soft with active bowel sounds No clubbing cyanosis none Edema Alert and oriented, grossly normal motor and sensory function Skin Warm and Dry  Atrial fibrillation with a ventricular response of 90 beats per minute  Assessment and  Plan  

## 2011-04-19 NOTE — Telephone Encounter (Signed)
[id number] Spoke to wife about pt complaints of heart being out of rhythm and chest pain. The pt has been treated per Dr Veatrice Kells for cough/bronchitis type illness since Nov 30th. He has had kenalog injections, 2 shots of Rocephin, finished prednisone taper this past Monday. Also completed 10 day course of Levaquin. The pt is feeling better in regards to his bronchitis but is now having intermittent episodes of chest pain and irregular heart beat that have been going on since Monday. When he has the episodes he is very weak. Wife states the pt needs to be seen today and his schedule is wide open. Advised will discuss the pt with Dr Graciela Husbands and return a call to her.  [id number] Discussed pt with Dr Graciela Husbands he advised to have the pt come at 1130 today.  [id number] Wife notified that pt will be seen by Dr Graciela Husbands today at 1130 today.  [id number] Spoke to the pt he is not having chest pain at present but can still feel "PVC's". Advised pt of appt today at 1130am pt states he will be here.

## 2011-04-19 NOTE — Assessment & Plan Note (Addendum)
The patient has atrial fibrillation of at least 4 days duration probably longer. As such she'll need to be anticoagulated with TE guided cardioversion. He is quite symptomatic and so we'll increase his bisoprolol between now and his cardioversion. We have discussed anticoagulation and will put him on Rivaroxaban;  We will stop his aspirin  I reviewed the potential risks and benefits with him and I discussed this also with his wife

## 2011-04-20 ENCOUNTER — Telehealth: Payer: Self-pay | Admitting: Internal Medicine

## 2011-04-20 NOTE — Telephone Encounter (Signed)
New Msg: Pt calling wanting to know results of pt lab work. Please return pt call to discuss further.

## 2011-04-20 NOTE — Telephone Encounter (Signed)
I spoke with the patient and his wife. 

## 2011-04-23 ENCOUNTER — Encounter (HOSPITAL_COMMUNITY): Payer: Self-pay | Admitting: Anesthesiology

## 2011-04-23 ENCOUNTER — Ambulatory Visit (HOSPITAL_COMMUNITY)
Admission: RE | Admit: 2011-04-23 | Discharge: 2011-04-23 | Disposition: A | Discharge status: Home / Self Care | Payer: BC Managed Care – PPO | Source: Ambulatory Visit | Attending: Cardiology | Admitting: Cardiology

## 2011-04-23 ENCOUNTER — Encounter (HOSPITAL_COMMUNITY)
Admission: RE | Disposition: A | Discharge status: Home / Self Care | Payer: Self-pay | Source: Ambulatory Visit | Attending: Cardiology

## 2011-04-23 ENCOUNTER — Encounter (HOSPITAL_COMMUNITY): Payer: Self-pay | Admitting: *Deleted

## 2011-04-23 ENCOUNTER — Ambulatory Visit (HOSPITAL_COMMUNITY): Payer: BC Managed Care – PPO | Admitting: Anesthesiology

## 2011-04-23 DIAGNOSIS — Z86718 Personal history of other venous thrombosis and embolism: Secondary | ICD-10-CM | POA: Insufficient documentation

## 2011-04-23 DIAGNOSIS — I4892 Unspecified atrial flutter: Secondary | ICD-10-CM

## 2011-04-23 DIAGNOSIS — R0609 Other forms of dyspnea: Secondary | ICD-10-CM | POA: Insufficient documentation

## 2011-04-23 DIAGNOSIS — I4891 Unspecified atrial fibrillation: Secondary | ICD-10-CM | POA: Insufficient documentation

## 2011-04-23 DIAGNOSIS — K219 Gastro-esophageal reflux disease without esophagitis: Secondary | ICD-10-CM | POA: Insufficient documentation

## 2011-04-23 DIAGNOSIS — R0602 Shortness of breath: Secondary | ICD-10-CM | POA: Insufficient documentation

## 2011-04-23 DIAGNOSIS — D682 Hereditary deficiency of other clotting factors: Secondary | ICD-10-CM | POA: Insufficient documentation

## 2011-04-23 DIAGNOSIS — Z87891 Personal history of nicotine dependence: Secondary | ICD-10-CM | POA: Insufficient documentation

## 2011-04-23 DIAGNOSIS — R0989 Other specified symptoms and signs involving the circulatory and respiratory systems: Secondary | ICD-10-CM | POA: Insufficient documentation

## 2011-04-23 HISTORY — DX: Adverse effect of unspecified anesthetic, initial encounter: T41.45XA

## 2011-04-23 HISTORY — PX: TEE WITHOUT CARDIOVERSION: SHX5443

## 2011-04-23 HISTORY — DX: Shortness of breath: R06.02

## 2011-04-23 SURGERY — ECHOCARDIOGRAM, TRANSESOPHAGEAL
Anesthesia: Moderate Sedation

## 2011-04-23 MED ORDER — TRAMADOL HCL 50 MG PO TABS: 50.0000 mg | ORAL_TABLET | Freq: Two times a day (BID) | ORAL | Status: DC | PRN

## 2011-04-23 MED ORDER — ASPIRIN EC 81 MG PO TBEC: 81.0000 mg | DELAYED_RELEASE_TABLET | Freq: Every day | ORAL | Status: DC

## 2011-04-23 MED ORDER — FENTANYL CITRATE 0.05 MG/ML IJ SOLN
INTRAMUSCULAR | Status: AC
  Filled 2011-04-23: qty 2

## 2011-04-23 MED ORDER — BUTAMBEN-TETRACAINE-BENZOCAINE 2-2-14 % EX AERO
INHALATION_SPRAY | CUTANEOUS | Status: DC | PRN
  Administered 2011-04-23: 2 via TOPICAL

## 2011-04-23 MED ORDER — MIDAZOLAM HCL 10 MG/2ML IJ SOLN
INTRAMUSCULAR | Status: DC | PRN
  Administered 2011-04-23 (×3): 2 mg via INTRAVENOUS

## 2011-04-23 MED ORDER — RIVAROXABAN 20 MG PO TABS
20.0000 mg | ORAL_TABLET | Freq: Every day | ORAL | Status: DC
  Filled 2011-04-23: qty 1

## 2011-04-23 MED ORDER — MIDAZOLAM HCL 10 MG/2ML IJ SOLN: 10.0000 mg | Freq: Once | INTRAMUSCULAR | Status: DC

## 2011-04-23 MED ORDER — HYDROCORTISONE 1 % EX CREA: 1.0000 "application " | TOPICAL_CREAM | Freq: Three times a day (TID) | CUTANEOUS | Status: DC | PRN

## 2011-04-23 MED ORDER — SODIUM CHLORIDE 0.45 % IV SOLN
INTRAVENOUS | Status: DC
  Administered 2011-04-23: 500 mL via INTRAVENOUS

## 2011-04-23 MED ORDER — FENTANYL CITRATE 0.05 MG/ML IJ SOLN: 250.0000 ug | Freq: Once | INTRAMUSCULAR | Status: DC

## 2011-04-23 MED ORDER — BENZOCAINE 20 % MT SOLN: 1.0000 "application " | OROMUCOSAL | Status: DC | PRN

## 2011-04-23 MED ORDER — NEBIVOLOL HCL 5 MG PO TABS: 5.0000 mg | ORAL_TABLET | Freq: Two times a day (BID) | ORAL | Status: DC

## 2011-04-23 MED ORDER — PANTOPRAZOLE SODIUM 40 MG PO TBEC: 40.0000 mg | DELAYED_RELEASE_TABLET | Freq: Every day | ORAL | Status: DC

## 2011-04-23 MED ORDER — MIDAZOLAM HCL 10 MG/2ML IJ SOLN
INTRAMUSCULAR | Status: AC
  Filled 2011-04-23: qty 2

## 2011-04-23 MED ORDER — PROPOFOL 10 MG/ML IV EMUL
INTRAVENOUS | Status: DC | PRN
  Administered 2011-04-23: 60 mg via INTRAVENOUS

## 2011-04-23 MED ORDER — FENTANYL CITRATE 0.05 MG/ML IJ SOLN
INTRAMUSCULAR | Status: DC | PRN
  Administered 2011-04-23 (×3): 25 ug via INTRAVENOUS

## 2011-04-23 NOTE — Anesthesia Preprocedure Evaluation (Addendum)
Anesthesia Evaluation  Patient identified by MRN, date of birth, ID band Patient awake    Reviewed: Allergy & Precautions, H&P , NPO status , Patient's Chart, lab work & pertinent test results  History of Anesthesia Complications Negative for: history of anesthetic complications  Airway Mallampati: II TM Distance: >3 FB Neck ROM: Full    Dental No notable dental hx. (+) Teeth Intact   Pulmonary former smoker   Pulmonary exam normal       Cardiovascular + dysrhythmias Atrial Fibrillation Irregular     Neuro/Psych Negative Neurological ROS  Negative Psych ROS   GI/Hepatic Neg liver ROS, GERD-  Controlled,  Endo/Other  Negative Endocrine ROS  Renal/GU negative Renal ROS     Musculoskeletal   Abdominal Normal abdominal exam  (+)   Peds  Hematology  (+) Blood dyscrasia, ,   Anesthesia Other Findings   Reproductive/Obstetrics                          Anesthesia Physical Anesthesia Plan  ASA: III  Anesthesia Plan: General   Post-op Pain Management:    Induction: Intravenous  Airway Management Planned: Mask  Additional Equipment:   Intra-op Plan:   Post-operative Plan: Possible Post-op intubation/ventilation  Informed Consent: I have reviewed the patients History and Physical, chart, labs and discussed the procedure including the risks, benefits and alternatives for the proposed anesthesia with the patient or authorized representative who has indicated his/her understanding and acceptance.   Dental advisory given  Plan Discussed with: CRNA, Anesthesiologist and Surgeon  Anesthesia Plan Comments:         Anesthesia Quick Evaluation

## 2011-04-23 NOTE — Interval H&P Note (Signed)
History and Physical Interval Note:  04/23/2011 11:19 AM  Frank Clark  has presented today for surgery, with the diagnosis of * No pre-op diagnosis entered *  The various methods of treatment have been discussed with the patient and family. After consideration of risks, benefits and other options for treatment, the patient has consented to  Procedure(s): TRANSESOPHAGEAL ECHOCARDIOGRAM (TEE) as a surgical intervention .  The patients' history has been reviewed, patient examined, no change in status, stable for surgery.  I have reviewed the patients' chart and labs.  Questions were answered to the patient's satisfaction.     Rose-Marie Hickling [employer]

## 2011-04-23 NOTE — H&P (View-Only) (Signed)
  HPI  Frank Clark is a 60 y.o. male Seen at the request of his wife because of shortness of breath and dyspnea on exertion. About 2 weeks ago he developed a sinus infection with drainage and a sore throat. He noted earlier this week that he had dyspnea on exertion and he felt" like his PVCs" were going crazy.  He has also been quite fatigued.  He has a history of paroxysmal atrial fibrillation and PVCs. We have tried him on a variety of beta-blockers. He is currently taking systolic which has been the best for him. He's had just infrequent PVCs. There is not severe lethargy on the 5 mg dosing.   Myoview scan in April 2011 with normal.  He tells me he has factor V Leiden deficiency. He has a history of a clot in his leg. He was told that he could stop his anticoagulation after 6 months.   Past Medical History  Diagnosis Date  . DVT, lower extremity     left  . Diverticulitis   . RMSF [facility] spotted fever)   . PVC (premature ventricular contraction)   . Atrial fibrillation     assoc with diverticulitis and RMSF  . Factor V deficiency     Past Surgical History  Procedure Date  . Lumbar laminectomy 2005  . Inguinal hernia repair     left  . Tonsillectomy     Current Outpatient Prescriptions  Medication Sig Dispense Refill  . aspirin 81 MG tablet Take 81 mg by mouth daily.        . nebivolol (BYSTOLIC) 5 MG tablet Take 1 tablet (5 mg total) by mouth daily.  30 tablet  11  . omeprazole (PRILOSEC) 10 MG capsule Take 10 mg by mouth as needed.        . traMADol (ULTRAM) 50 MG tablet         Allergies  Allergen Reactions  . Sulfonamide Derivatives     REACTION: Reaction not known    Review of Systems negative except from HPI and PMH  Physical Exam Well developed and well nourished in no acute distress HENT normal E scleral and icterus clear Neck Supple JVP flat; carotids brisk and full Clear to ausculation irregularly irregular rate and rhythm, 2/6 early  systolic murmurs Soft with active bowel sounds No clubbing cyanosis none Edema Alert and oriented, grossly normal motor and sensory function Skin Warm and Dry  Atrial fibrillation with a ventricular response of 90 beats per minute  Assessment and  Plan

## 2011-04-23 NOTE — Procedures (Signed)
[facility]  Procedure: TEE  Indication: Atrial fibrillation pre-cardioversion  Findings: Brief report of TEE.  Please see procedure section TEE report.  Normal LV size with mild LV hypertrophy.  EF 60-65%.  No LAA thrombus.    OK to proceed to DCCV

## 2011-04-23 NOTE — Transfer of Care (Signed)
Immediate Anesthesia Transfer of Care Note  Patient: Frank Clark  Procedure(s) Performed:  TRANSESOPHAGEAL ECHOCARDIOGRAM (TEE)  Patient Location: PACU and Endoscopy Unit  Anesthesia Type: General  Level of Consciousness: awake, oriented, sedated, patient cooperative and responds to stimulation  Airway & Oxygen Therapy: Patient Spontanous Breathing and Patient connected to nasal cannula oxygen  Post-op Assessment: Report given to PACU RN and Post -op Vital signs reviewed and stable  Post vital signs: Reviewed and stable  Complications: No apparent anesthesia complications

## 2011-04-23 NOTE — Procedures (Signed)
Electrical Cardioversion Procedure Note Frank Clark [id number] [birth year]  Procedure: Electrical Cardioversion Indications:  Atrial Fibrillation  Procedure Details Consent: Risks of procedure as well as the alternatives and risks of each were explained to the (patient/caregiver).  Consent for procedure obtained. Time Out: Verified patient identification, verified procedure, site/side was marked, verified correct patient position, special equipment/implants available, medications/allergies/relevent history reviewed, required imaging and test results available.  Performed  Patient placed on cardiac monitor, pulse oximetry, supplemental oxygen as necessary.  Sedation given: Propofol IV Pacer pads placed anterior and posterior chest.  Cardioverted 1 time(s).  Cardioverted at 200J.  Evaluation Findings: Post procedure EKG shows: NSR Complications: None Patient did tolerate procedure well.   Marca Ancona 04/23/2011, 11:56 AM

## 2011-04-23 NOTE — Progress Notes (Signed)
  Echocardiogram Echocardiogram Transesophageal has been performed.  Dorena Cookey 04/23/2011, 4:09 PM

## 2011-04-23 NOTE — Preoperative (Signed)
Beta Blockers   Reason not to administer Beta Blockers:Not Applicable 

## 2011-04-23 NOTE — OR Nursing (Signed)
TEE w/Cardioversion  1149-One Shock at 200j-converted to NSR

## 2011-04-23 NOTE — Anesthesia Postprocedure Evaluation (Signed)
  Anesthesia Post-op Note  Patient: Designer, industrial/product  Procedure(s) Performed:  TRANSESOPHAGEAL ECHOCARDIOGRAM (TEE)  Patient Location: PACU and Endoscopy Unit  Anesthesia Type: General  Level of Consciousness: awake  Airway and Oxygen Therapy: Patient Spontanous Breathing and Patient connected to nasal cannula oxygen  Post-op Pain: none  Post-op Assessment: Post-op Vital signs reviewed, Patient's Cardiovascular Status Stable and Respiratory Function Stable  Post-op Vital Signs: Reviewed and stable  Complications: No apparent anesthesia complications

## 2011-04-24 ENCOUNTER — Encounter (HOSPITAL_COMMUNITY): Payer: Self-pay | Admitting: Cardiology

## 2011-04-25 ENCOUNTER — Telehealth: Payer: Self-pay | Admitting: Internal Medicine

## 2011-04-25 NOTE — Telephone Encounter (Signed)
LOV,12,TEE faxed to Dr.Schultz @ [phone] 04/25/11/km

## 2011-05-28 ENCOUNTER — Telehealth: Payer: Self-pay | Admitting: Internal Medicine

## 2011-05-28 NOTE — Telephone Encounter (Signed)
He is correct  4 weeks of  Rivaroxaban  follwoup for me in 3-91months

## 2011-05-28 NOTE — Telephone Encounter (Signed)
New Problem:     Patient had a Cardioversion last month and was instructed to take his Rivaroxaban (XARELTO) 20 MG TABS for 1 month following his surgery.  His month has passed and he would like some instruction on how to proceed medication and OV wise. Please call back.

## 2011-05-28 NOTE — Telephone Encounter (Signed)
SPOKE WITH PT. PT CALLED  TO SEE IF NEEDED TO CONT WITH XARELTO ( WAS TOLD ONLY TO TAKE  FOR 1 MONTH ) AND ALSO  DOES PT NEED F/U AFTER CARDIOVERSION  PROCEDURE DONE MID  December  2012. NO APPT SCEHDULED HAS  RECALL IN COMPUTER FOR OCT 2013 . PT AWARE  WILL FORWARD TO DR Graciela Husbands FOR REVIEW .HAS ONLY 1 MORE XARELTO  LEFT .Zack Seal

## 2011-05-29 NOTE — Telephone Encounter (Signed)
I spoke with the patient. He is aware of Dr. Odessa Fleming recommendations. He is scheduled to see Dr. Graciela Husbands on 3/14 at 10:15am.

## 2011-05-29 NOTE — Telephone Encounter (Signed)
LMTC

## 2011-07-19 ENCOUNTER — Ambulatory Visit: Payer: BC Managed Care – PPO | Admitting: Internal Medicine

## 2011-08-21 ENCOUNTER — Ambulatory Visit (INDEPENDENT_AMBULATORY_CARE_PROVIDER_SITE_OTHER): Payer: BC Managed Care – PPO | Admitting: Internal Medicine

## 2011-08-21 ENCOUNTER — Encounter: Payer: Self-pay | Admitting: Internal Medicine

## 2011-08-21 VITALS — BP 114/78 | HR 51 | Ht 72.0 in | Wt 188.0 lb

## 2011-08-21 DIAGNOSIS — I4891 Unspecified atrial fibrillation: Secondary | ICD-10-CM

## 2011-08-21 NOTE — Patient Instructions (Signed)
Your physician wants you to follow-up in: 1 year with Dr. Klein. You will receive a reminder letter in the mail two months in advance. If you don't receive a letter, please call our office to schedule the follow-up appointment.  Your physician recommends that you continue on your current medications as directed. Please refer to the Current Medication list given to you today.  

## 2011-08-21 NOTE — Progress Notes (Signed)
   HPI  Frank Clark is a 61 y.o. male Seen in followup of atrial fibrillation at present a number of months ago as shortness of breath and dyspnea on exertion.    Echo EF 60-65% at TEE 12/12   Myoview scan in April 2011 with normal.  He tells me he has factor V Leiden deficiency. He has a history of a clot in his leg. He was told that he could stop his anticoagulation after 6 months.  Past Medical History  Diagnosis Date  . DVT, lower extremity     left  . Diverticulitis   . RMSF [facility] spotted fever)   . PVC (premature ventricular contraction)   . Atrial fibrillation     assoc with diverticulitis and RMSF  . Factor V deficiency   . Shortness of breath   . Complication of anesthesia 02/24/2011    Hard to wake up after Gallbladder    Past Surgical History  Procedure Date  . Lumbar laminectomy 2005  . Inguinal hernia repair     left  . Tonsillectomy   . Cholecystectomy   . Tee without cardioversion 04/23/2011    Procedure: TRANSESOPHAGEAL ECHOCARDIOGRAM (TEE);  Surgeon: Marca Ancona, MD;  Location: [facility] ENDOSCOPY;  Service: Cardiovascular;  Laterality: N/A;    Current Outpatient Prescriptions  Medication Sig Dispense Refill  . aspirin 81 MG tablet Take 81 mg by mouth daily.        . nebivolol (BYSTOLIC) 5 MG tablet Take 5 mg by mouth daily.      Marland Kitchen omeprazole (PRILOSEC) 10 MG capsule Take 10 mg by mouth as needed.        . traMADol (ULTRAM) 50 MG tablet       . DISCONTD: nebivolol (BYSTOLIC) 5 MG tablet Take 1 tablet (5 mg total) by mouth 2 (two) times daily.  30 tablet  11    Allergies  Allergen Reactions  . Sulfonamide Derivatives     REACTION: Reaction not known    Review of Systems negative except from HPI and PMH  Physical Exam BP 114/78  Pulse 51  Ht 6' (1.829 m)  Wt 188 lb (85.276 kg)  BMI 25.50 kg/m2 Well developed and nourished in no acute distress HENT normal Neck supple with JVP-flat Clear Regular rate and rhythm, no murmurs or  gallops Abd-soft with active BS No Clubbing cyanosis edema Skin-warm and dry A & Oriented  Grossly normal sensory and motor function   Assessment and  Plan

## 2011-08-21 NOTE — Assessment & Plan Note (Signed)
The patient is holding sinus rhythm. We discussed as to whether it was related to the acute bronchitic episode. We'll just have to wait and see. I have instructed him to take his pulse every evening in morning and not wait he has reversion to atrial fibrillation and will have a window in which to proceed without the need for anticoagulation. His CHADS-VASc score is 0-1

## 2011-08-24 ENCOUNTER — Ambulatory Visit: Payer: BC Managed Care – PPO | Admitting: Internal Medicine

## 2012-03-28 ENCOUNTER — Encounter: Payer: Self-pay | Admitting: Internal Medicine

## 2012-03-28 ENCOUNTER — Ambulatory Visit (INDEPENDENT_AMBULATORY_CARE_PROVIDER_SITE_OTHER): Payer: BC Managed Care – PPO | Admitting: Internal Medicine

## 2012-03-28 VITALS — BP 142/70 | HR 68 | Resp 18 | Ht 72.0 in | Wt 192.1 lb

## 2012-03-28 DIAGNOSIS — I4949 Other premature depolarization: Secondary | ICD-10-CM

## 2012-03-28 DIAGNOSIS — D6859 Other primary thrombophilia: Secondary | ICD-10-CM

## 2012-03-28 DIAGNOSIS — I4891 Unspecified atrial fibrillation: Secondary | ICD-10-CM

## 2012-03-28 DIAGNOSIS — D6851 Activated protein C resistance: Secondary | ICD-10-CM

## 2012-03-28 MED ORDER — NEBIVOLOL HCL 5 MG PO TABS: 5.0000 mg | ORAL_TABLET | Freq: Every day | ORAL | Status: DC

## 2012-03-28 NOTE — Assessment & Plan Note (Signed)
No associated symptoms

## 2012-03-28 NOTE — Assessment & Plan Note (Signed)
No known recurrent atrial fibrillation. He is not taking aspirin

## 2012-03-28 NOTE — Progress Notes (Signed)
Patient Care Team: Frank Clark as PCP - General (Internal Medicine) Frank Clark (Internal Medicine)   HPI  Frank Clark is a 61 y.o. male Seen in followup of atrial fibrillation presenting 2013 ago as shortness of breath and dyspnea on exertion.   Echo EF 60-65% at TEE 12/12  Myoview scan in April 2011 with normal.  He tells me he has factor V Leiden deficiency. He has a history of a clot in his leg.  The patient denies chest pain, shortness of breath, nocturnal dyspnea, orthopnea or peripheral edema.  There have been no palpitations, lightheadedness or syncope.     Past Medical History  Diagnosis Date  . DVT, lower extremity     left  . Diverticulitis   . RMSF [facility] spotted fever)   . PVC (premature ventricular contraction)   . Atrial fibrillation     assoc with diverticulitis and RMSF  . Factor V deficiency   . Shortness of breath   . Complication of anesthesia 02/24/2011    Hard to wake up after Gallbladder    Past Surgical History  Procedure Date  . Lumbar laminectomy 2005  . Inguinal hernia repair     left  . Tonsillectomy   . Cholecystectomy   . Tee without cardioversion 04/23/2011    Procedure: TRANSESOPHAGEAL ECHOCARDIOGRAM (TEE);  Surgeon: Marca Ancona, MD;  Location: [facility] ENDOSCOPY;  Service: Cardiovascular;  Laterality: N/A;    Current Outpatient Prescriptions  Medication Sig Dispense Refill  . nebivolol (BYSTOLIC) 5 MG tablet Take 5 mg by mouth daily.      Marland Kitchen omeprazole (PRILOSEC) 10 MG capsule Take 10 mg by mouth as needed.        . traMADol (ULTRAM) 50 MG tablet       . aspirin 81 MG tablet Take 81 mg by mouth daily.          Allergies  Allergen Reactions  . Sulfonamide Derivatives     REACTION: Reaction not known    Review of Systems negative except from HPI and PMH  Physical Exam BP 142/70  Pulse 68  Resp 18  Ht 6' (1.829 m)  Wt 192 lb 1.9 oz (87.145 kg)  BMI 26.06 kg/m2  SpO2 94%Well developed and nourished in  no acute distress HENT normal Neck supple with JVP-flat Carotids brisk and full without bruits Clear Regular rate and rhythm, no murmurs or gallops Abd-soft with active BS without hepatomegaly No Clubbing cyanosis edema Skin-warm and dry A & Oriented  Grossly normal sensory and motor function  Assessment and  Plan   We'll echocardiogram demonstrates sinus rhythm at 57 Interval 17/09/41 IQW

## 2012-03-28 NOTE — Assessment & Plan Note (Signed)
Spoken with the family regarding the importance of screening particularly of the women. He will have his sister scrreened  She has 2 daughters

## 2013-05-07 ENCOUNTER — Other Ambulatory Visit: Payer: Self-pay | Admitting: Internal Medicine

## 2013-05-18 ENCOUNTER — Ambulatory Visit (INDEPENDENT_AMBULATORY_CARE_PROVIDER_SITE_OTHER): Payer: BC Managed Care – PPO | Admitting: Internal Medicine

## 2013-05-18 ENCOUNTER — Encounter: Payer: Self-pay | Admitting: Internal Medicine

## 2013-05-18 VITALS — BP 137/81 | HR 60 | Ht 72.0 in | Wt 204.8 lb

## 2013-05-18 DIAGNOSIS — M79609 Pain in unspecified limb: Secondary | ICD-10-CM

## 2013-05-18 DIAGNOSIS — M79605 Pain in left leg: Secondary | ICD-10-CM

## 2013-05-18 DIAGNOSIS — I4949 Other premature depolarization: Secondary | ICD-10-CM

## 2013-05-18 DIAGNOSIS — R9431 Abnormal electrocardiogram [ECG] [EKG]: Secondary | ICD-10-CM

## 2013-05-18 DIAGNOSIS — M79604 Pain in right leg: Secondary | ICD-10-CM | POA: Insufficient documentation

## 2013-05-18 DIAGNOSIS — I4891 Unspecified atrial fibrillation: Secondary | ICD-10-CM

## 2013-05-18 MED ORDER — VERAPAMIL HCL 40 MG PO TABS: 40.0000 mg | ORAL_TABLET | Freq: Three times a day (TID) | ORAL | Status: DC | PRN

## 2013-05-18 NOTE — Patient Instructions (Addendum)
Your physician has recommended you make the following change in your medication:  1) Stop Bystolic 2) Start Verapamil 40 mg every 8 hours as needed   Your physician wants you to follow-up in: 1 year with Dr. Caryl Comes.  You will receive a reminder letter in the mail two months in advance. If you don't receive a letter, please call our office to schedule the follow-up appointment.

## 2013-05-18 NOTE — Assessment & Plan Note (Signed)
He wonders whether the leg discomfort, that both he and his wife had, may be related to his Bystolic. We will undertake a withdrawal trial and use when necessary verapamil as a short-term alternative. He will let us know in 2-3 weeks how he is doing

## 2013-05-18 NOTE — Assessment & Plan Note (Signed)
No symptomatic afib

## 2013-05-18 NOTE — Progress Notes (Signed)
      Patient Care Team: Nicoletta Dress, MD as PCP - General (Internal Medicine) Nicoletta Dress, MD (Internal Medicine)   HPI  Frank Clark is a 63 y.o. male seen in followup of atrial fibrillation presenting 2013 ago as shortness of breath and dyspnea on exertion.  Echo EF 60-65% at TEE 12/12  Myoview scan in April 2011 with normal.   He tells me he has factor V Leiden deficiency. He has a history of a clot in his leg.   The patient denies chest pain, shortness of breath, nocturnal dyspnea, orthopnea or peripheral edema. There have been no palpitations, lightheadedness or syncope.  He is having aches in his lower legs. In his wife both take Bystolic and  Both re having similar symptoms.   Past Medical History  Diagnosis Date  . DVT, lower extremity     left  . Diverticulitis   . RMSF [facility] spotted fever)   . PVC (premature ventricular contraction)   . Atrial fibrillation     assoc with diverticulitis and RMSF  . Factor V deficiency   . Shortness of breath   . Complication of anesthesia 02/24/2011    Hard to wake up after Gallbladder    Past Surgical History  Procedure Laterality Date  . Lumbar laminectomy  2005  . Inguinal hernia repair      left  . Tonsillectomy    . Cholecystectomy    . Tee without cardioversion  04/23/2011    Procedure: TRANSESOPHAGEAL ECHOCARDIOGRAM (TEE);  Surgeon: Loralie Champagne, MD;  Location: [facility] ENDOSCOPY;  Service: Cardiovascular;  Laterality: N/A;    Current Outpatient Prescriptions  Medication Sig Dispense Refill  . BYSTOLIC 5 MG tablet TAKE ONE TABLET BY MOUTH EVERY DAY  90 tablet  0  . omeprazole (PRILOSEC) 10 MG capsule Take 10 mg by mouth as needed.        . traMADol (ULTRAM) 50 MG tablet        No current facility-administered medications for this visit.    Allergies  Allergen Reactions  . Sulfonamide Derivatives     REACTION: Reaction not known    Review of Systems negative except from HPI and  PMH  Physical Exam BP 137/81  Pulse 60  Ht 6' (1.829 m)  Wt 204 lb 12.8 oz (92.897 kg)  BMI 27.77 kg/m2 Well developed and well nourished in no acute distress HENT normal E scleral and icterus clear Neck Supple JVP flat; carotids brisk and full Clear to ausculation  Regular rate and rhythm, no murmurs gallops or rub Soft with active bowel sounds No clubbing cyanosis none Edema Alert and oriented, grossly normal motor and sensory function Skin Warm and Dry   ECG demonstrates sinus rhythm at 60 Intervals 16/09/39 Unchanged 2013 Assessment and  Plan

## 2013-05-18 NOTE — Assessment & Plan Note (Signed)
Stable

## 2013-08-13 ENCOUNTER — Other Ambulatory Visit: Payer: Self-pay

## 2013-08-13 ENCOUNTER — Other Ambulatory Visit: Payer: Self-pay | Admitting: Internal Medicine

## 2013-12-03 ENCOUNTER — Other Ambulatory Visit: Payer: Self-pay | Admitting: Internal Medicine

## 2013-12-10 ENCOUNTER — Other Ambulatory Visit: Payer: Self-pay | Admitting: Internal Medicine

## 2013-12-11 ENCOUNTER — Telehealth: Payer: Self-pay | Admitting: *Deleted

## 2013-12-11 MED ORDER — NEBIVOLOL HCL 5 MG PO TABS: 5.0000 mg | ORAL_TABLET | Freq: Every day | ORAL | Status: AC

## 2013-12-11 NOTE — Telephone Encounter (Signed)
Received a refill request from [facility] for [place]. Spoke with wife and he stated that at last ov Dr Caryl Comes told him if he wanted to stop Bystolic he could or continue. Patient decided to continue Bystolic, Rx sent to [facility]. Confirmed with wife he is not taking Verapamil.

## 2014-08-30 ENCOUNTER — Encounter: Payer: Self-pay | Admitting: Internal Medicine

## 2014-08-30 ENCOUNTER — Ambulatory Visit (INDEPENDENT_AMBULATORY_CARE_PROVIDER_SITE_OTHER): Payer: BLUE CROSS/BLUE SHIELD | Admitting: Internal Medicine

## 2014-08-30 VITALS — BP 124/80 | HR 86 | Ht 72.0 in | Wt 214.6 lb

## 2014-08-30 DIAGNOSIS — I480 Paroxysmal atrial fibrillation: Secondary | ICD-10-CM

## 2014-08-30 NOTE — Progress Notes (Signed)
Patient Care Team: Nicoletta Dress, MD as PCP - General (Internal Medicine) Nicoletta Dress, MD (Internal Medicine)   HPI  Frank Clark is a 64 y.o. male seen in followup of atrial fibrillation presenting 2013 ago as shortness of breath and dyspnea on exertion.  Echo EF 60-65% at TEE 12/12  Myoview scan in April 2011 with normal.   He tells me he has factor V Leiden deficiency. He has a history of a clot in his leg.    he's had no intercurrent atrial fibrillation of which  he is aware.   He has had profound fatigue and will fall asleep as soon as he sits down.   he has been struggling with GE reflux. He has recently seen in GI in [place].   He also has a problem with coughing up a brownish material every morning. He says it looks like a scab.     Past Medical History  Diagnosis Date  . DVT, lower extremity     left  . Diverticulitis   . RMSF [facility] spotted fever)   . PVC (premature ventricular contraction)   . Atrial fibrillation     assoc with diverticulitis and RMSF  . Factor V deficiency   . Shortness of breath   . Complication of anesthesia 02/24/2011    Hard to wake up after Gallbladder    Past Surgical History  Procedure Laterality Date  . Lumbar laminectomy  2005  . Inguinal hernia repair      left  . Tonsillectomy    . Cholecystectomy    . Tee without cardioversion  04/23/2011    Procedure: TRANSESOPHAGEAL ECHOCARDIOGRAM (TEE);  Surgeon: Loralie Champagne, MD;  Location: [facility] ENDOSCOPY;  Service: Cardiovascular;  Laterality: N/A;    Current Outpatient Prescriptions  Medication Sig Dispense Refill  . nebivolol (BYSTOLIC) 5 MG tablet Take 1 tablet (5 mg total) by mouth daily. 90 tablet 1  . ranitidine (ZANTAC) 300 MG tablet Take 300 mg by mouth at bedtime.    . traMADol (ULTRAM) 50 MG tablet Take 50 mg by mouth every 6 (six) hours as needed for moderate pain or severe pain.      No current facility-administered medications for this  visit.    Allergies  Allergen Reactions  . Sulfonamide Derivatives     REACTION: Reaction not known    Review of Systems negative except from HPI and PMH  Physical Exam BP 124/80 mmHg  Pulse 86  Ht 6' (1.829 m)  Wt 214 lb 9.6 oz (97.342 kg)  BMI 29.10 kg/m2 Well developed and well nourished in no acute distress HENT normal E scleral and icterus clear Neck Supple JVP flat; carotids brisk and full Clear to ausculation  Regular rate and rhythm, no murmurs gallops or rub Soft with active bowel sounds No clubbing cyanosis none Edema Alert and oriented, grossly normal motor and sensory function Skin Warm and Dry   ECG demonstrates sinus rhythm at  86  Intervals 15/08/36   left axis deviation  Possible IMI   No changes from 2015 Assessment and  Plan  atrial fibrillation -paroxysmal   PVC   Fatigue and daytime somnolence   GE reflux disease   Expectorated brownish material related to his work   stress   abnormal ECG   We will continue him on his beta blocker for his atrial fibrillation ; this will also help with his PVCs.   We discussed the strategy for evaluation of his sleep  apnea; he would like to pursue this with Dr. Elissa Hefty   His wife worked with the ENT physician; he will talk discuss with her an ENT contact.     In the event that he needs  ENT and/or sleep referral, we will be glad to help with this.   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

## 2014-08-30 NOTE — Patient Instructions (Signed)
Medication Instructions:  Your physician recommends that you continue on your current medications as directed. Please refer to the Current Medication list given to you today.  Labwork: None  Testing/Procedures: None  Follow-Up: Your physician wants you to follow-up in: 1 year with Dr. Caryl Comes.  You will receive a reminder letter in the mail two months in advance. If you don't receive a letter, please call our office to schedule the follow-up appointment.   Any Other Special Instructions Will Be Listed Below (If Applicable).

## 2015-05-31 ENCOUNTER — Telehealth: Payer: Self-pay | Admitting: Internal Medicine

## 2015-05-31 NOTE — Telephone Encounter (Signed)
I called and spoke with the patient. He states that he had hernia surgery on 12/30, but a week prior to that, he had some pain in his back and had some type of injection. On 1/2 or 1/3 he had an epidural injection to his back.  Around 1/8 he started having right leg and side pain, but this resolved the next day. A few days later developed left leg pain and swelling. On 05/23/15 he went to see his PCP and he was sent for a lower extremity doppler. This was positive for a clot from the calf to his groin. He was placed on Xarelto 15 mg BID x 3 weeks and plans for a maintenance dose at 20 mg daily x 6 months after that. The patient states his PCP told him to stay off his leg for a week, so he states he did this and kept the extremity elevated. He just started to ambulate yesterday for the first time. He states that he was still having pain in the effected extremity which is some better today. He wanted Dr. Olin Pia thoughts on if he is on the right course of treatment due to the pain he is having. I advised the patient I would review with Dr. Caryl Comes and call him back. He is agreeable.

## 2015-05-31 NOTE — Telephone Encounter (Signed)
NeW Message  Pt c/o swelling: STAT is pt has developed SOB within 24 hours  Pt has appt 1/26 w/Renee. Please call back and discuss.   1. How long have you been experiencing swelling? 9 days  2. Where is the swelling located? Mid calf to groin  3.  Are you currently taking a "fluid pill"? n/a   4.  Are you currently SOB? no  5.  Have you traveled recently?no

## 2015-05-31 NOTE — Telephone Encounter (Signed)
Reviewed with Dr. Caryl Comes- course of treatment with Xarelto is appropriate. Discomfort in the effected extremity is normal- should continue to improve. Per the patient, discomfort was better today than yesterday. No further changes needed and he should not need follow up with Renee on 1/26. The patient and his wife are aware.

## 2015-06-02 ENCOUNTER — Ambulatory Visit: Payer: BLUE CROSS/BLUE SHIELD | Admitting: Physician Assistant

## 2017-04-12 DIAGNOSIS — Z23 Encounter for immunization: Secondary | ICD-10-CM | POA: Diagnosis not present

## 2017-10-23 DIAGNOSIS — H43393 Other vitreous opacities, bilateral: Secondary | ICD-10-CM | POA: Diagnosis not present

## 2017-10-23 DIAGNOSIS — H2513 Age-related nuclear cataract, bilateral: Secondary | ICD-10-CM | POA: Diagnosis not present

## 2017-11-30 DIAGNOSIS — I872 Venous insufficiency (chronic) (peripheral): Secondary | ICD-10-CM | POA: Diagnosis not present

## 2017-11-30 DIAGNOSIS — K5732 Diverticulitis of large intestine without perforation or abscess without bleeding: Secondary | ICD-10-CM | POA: Diagnosis not present

## 2017-12-05 DIAGNOSIS — K5732 Diverticulitis of large intestine without perforation or abscess without bleeding: Secondary | ICD-10-CM | POA: Diagnosis not present

## 2017-12-19 DIAGNOSIS — Z9049 Acquired absence of other specified parts of digestive tract: Secondary | ICD-10-CM | POA: Diagnosis not present

## 2017-12-19 DIAGNOSIS — N281 Cyst of kidney, acquired: Secondary | ICD-10-CM | POA: Diagnosis not present

## 2017-12-19 DIAGNOSIS — K7689 Other specified diseases of liver: Secondary | ICD-10-CM | POA: Diagnosis not present

## 2017-12-19 DIAGNOSIS — K760 Fatty (change of) liver, not elsewhere classified: Secondary | ICD-10-CM | POA: Diagnosis not present

## 2017-12-19 DIAGNOSIS — K5732 Diverticulitis of large intestine without perforation or abscess without bleeding: Secondary | ICD-10-CM | POA: Diagnosis not present

## 2018-02-25 DIAGNOSIS — Z23 Encounter for immunization: Secondary | ICD-10-CM | POA: Diagnosis not present

## 2018-03-04 DIAGNOSIS — J208 Acute bronchitis due to other specified organisms: Secondary | ICD-10-CM | POA: Diagnosis not present

## 2018-04-14 ENCOUNTER — Telehealth: Payer: Self-pay | Admitting: Internal Medicine

## 2018-04-14 NOTE — Telephone Encounter (Signed)
Spoke with the patient's wife, she stated her husband has been having PVCs and PACs for the past week. He also has had low HRs below 50s. He also has had exertional chest pain that resolves with relaxation. Advised the patient to go to the hospital if the symptoms worsen or chest pain gets worse. The patient requested an asap follow up with Dr. Caryl Comes, sending message to Dr. Olin Pia nurse for follow up.

## 2018-04-14 NOTE — Telephone Encounter (Signed)
Patient c/o Palpitations:  High priority if patient c/o lightheadedness, shortness of breath, or chest pain  1) How long have you had palpitations/irregular HR/ Afib? Are you having the symptoms now?Started about 2 weeks ago- making him weak  2) Are you currently experiencing lightheadedness, SOB or CP? Some chest pain, but not at the moment  3) Do you have a history of afib (atrial fibrillation) or irregular heart rhythm? Yes ,PVC's and PAC's  4) Have you checked your BP or HR? (document readings if available): Blood Pressure is good 120/64  5) Are you experiencing any other symptoms?  Weakness and chest pains off and on- pt wants to be seen asap-last seen 2016

## 2018-04-15 NOTE — Telephone Encounter (Signed)
Attempted to call pt x2 to set up a new pt appointment. No answer and no voicemail set up.

## 2018-05-23 ENCOUNTER — Ambulatory Visit (INDEPENDENT_AMBULATORY_CARE_PROVIDER_SITE_OTHER): Payer: Medicare Other | Admitting: Internal Medicine

## 2018-05-23 ENCOUNTER — Encounter (INDEPENDENT_AMBULATORY_CARE_PROVIDER_SITE_OTHER): Payer: Self-pay

## 2018-05-23 ENCOUNTER — Encounter: Payer: Self-pay | Admitting: Internal Medicine

## 2018-05-23 VITALS — BP 106/80 | HR 62 | Ht 72.0 in | Wt 213.8 lb

## 2018-05-23 DIAGNOSIS — R51 Headache: Secondary | ICD-10-CM

## 2018-05-23 DIAGNOSIS — I480 Paroxysmal atrial fibrillation: Secondary | ICD-10-CM

## 2018-05-23 DIAGNOSIS — I493 Ventricular premature depolarization: Secondary | ICD-10-CM | POA: Diagnosis not present

## 2018-05-23 DIAGNOSIS — R519 Headache, unspecified: Secondary | ICD-10-CM

## 2018-05-23 NOTE — Patient Instructions (Signed)
Medication Instructions:  Your physician has recommended you make the following change in your medication:   1. Begin Eliquis, 5mg  tablet, two times per day; We will call you when it is time to begin this medication (after your MRI is completed and read.)  Labwork: None ordered.  Testing/Procedures: Your physician has recommended you have a MRI of your brain for your headaches.  After you MRI is completed and you are cleared, we will call to schedule you for your Cardioversion.  Your physician has recommended that you have a sleep study. This test records several body functions during sleep, including: brain activity, eye movement, oxygen and carbon dioxide blood levels, heart rate and rhythm, breathing rate and rhythm, the flow of air through your mouth and nose, snoring, body muscle movements, and chest and belly movement.   Follow-Up: Your physician recommends that you schedule a follow-up appointment in:   4 weeks after your Cardioversion is completed. We will schedule this when we arrange your Cardioversion.   Any Other Special Instructions Will Be Listed Below (If Applicable).     If you need a refill on your cardiac medications before your next appointment, please call your pharmacy.

## 2018-05-23 NOTE — Progress Notes (Signed)
ELECTROPHYSIOLOGY CONSULT NOTE  Patient ID: Frank Clark, MRN: [id number], DOB/AGE: May 15, 1950 68 y.o. Admit date: (Not on file) Date of Consult: 05/23/2018  Primary Physician: Nicoletta Dress, MD Primary Cardiologist: NEW     Frank Clark is a 68 y.o. male who is being seen today for the evaluation of palpitations at the request of Dr. Delena Bali  HPI Frank Clark is a 68 y.o. male seen most recently 2016 with a diagnosis of paroxysmal atrial fibrillation.  He had been managed with cardioversion and nebivolol.  He had been without symptoms until a month or 2 ago when he started noticing some irregularity.  No tachypalpitations.  Minimal exercise intolerance.  No chest pain no swelling no nocturnal dyspnea.  No transient neurological events.  Also complaining for the last month of headaches which are particularly left retrobulbar.  Also some pain in his left ear.     DATE TEST EF    2011 Myoview  No Ischemia    12/12 Echo   60-65 %         Factor V Leiden deficiency. history of a clot in his leg X 2.   . Thromboembolic risk factors ( age -8) for a CHADSVASc Score of 1    Past Medical History:  Diagnosis Date  . Atrial fibrillation (HCC)    assoc with diverticulitis and RMSF  . Complication of anesthesia 02/24/2011   Hard to wake up after Gallbladder  . Diverticulitis   . DVT, lower extremity ([place])    left  . Factor V deficiency ([place])   . PVC (premature ventricular contraction)   . RMSF [facility] spotted fever)   . Shortness of breath       Surgical History:  Past Surgical History:  Procedure Laterality Date  . CHOLECYSTECTOMY    . INGUINAL HERNIA REPAIR     left  . LUMBAR LAMINECTOMY  2005  . TEE WITHOUT CARDIOVERSION  04/23/2011   Procedure: TRANSESOPHAGEAL ECHOCARDIOGRAM (TEE);  Surgeon: Loralie Champagne, MD;  Location: [place];  Service: Cardiovascular;  Laterality: N/A;  . TONSILLECTOMY       Home Meds: Current Meds  Medication  Sig  . aspirin EC 81 MG tablet Take 1 tablet by mouth daily.  . famotidine (PEPCID AC) 10 MG tablet Take 10 mg by mouth daily as needed for heartburn or indigestion.  . nebivolol (BYSTOLIC) 5 MG tablet Take 1 tablet (5 mg total) by mouth daily.  . traMADol (ULTRAM) 50 MG tablet Take 50 mg by mouth every 6 (six) hours as needed for moderate pain or severe pain.     Allergies:  Allergies  Allergen Reactions  . Sulfonamide Derivatives     REACTION: Reaction not known    Social History   Socioeconomic History  . Marital status: Married    Spouse name: Not on file  . Number of children: Not on file  . Years of education: Not on file  . Highest education level: Not on file  Occupational History  . Occupation: Theme park manager  Social Needs  . Financial resource strain: Not on file  . Food insecurity:    Worry: Not on file    Inability: Not on file  . Transportation needs:    Medical: Not on file    Non-medical: Not on file  Tobacco Use  . Smoking status: Former Smoker    Last attempt to quit: 1980    Years since quitting: 40.0  . Smokeless tobacco: Never Used  Substance and Sexual Activity  . Alcohol use: No  . Drug use: No  . Sexual activity: Not on file  Lifestyle  . Physical activity:    Days per week: Not on file    Minutes per session: Not on file  . Stress: Not on file  Relationships  . Social connections:    Talks on phone: Not on file    Gets together: Not on file    Attends religious service: Not on file    Active member of club or organization: Not on file    Attends meetings of clubs or organizations: Not on file    Relationship status: Not on file  . Intimate partner violence:    Fear of current or ex partner: Not on file    Emotionally abused: Not on file    Physically abused: Not on file    Forced sexual activity: Not on file  Other Topics Concern  . Not on file  Social History Narrative  . Not on file     Family History  Problem Relation Age of Onset    . Stroke Father   . Cancer Mother   . Coronary artery disease Brother      ROS:  Please see the history of present illness.     All other systems reviewed and negative.    Physical Exam: Blood pressure 106/80, pulse 62, height 6' (1.829 m), weight 213 lb 12.8 oz (97 kg), SpO2 98 %. General: Well developed, well nourished male in no acute distress. Head: Normocephalic, atraumatic, sclera non-icteric, no xanthomas, nares are without discharge. EENT: normal  Lymph Nodes:  none Neck: Negative for carotid bruits. JVD not elevated. Back:without scoliosis kyphosis Lungs: Clear bilaterally to auscultation without wheezes, rales, or rhonchi. Breathing is unlabored. Heart: Irregular rate and rhythm without murmur . No rubs, or gallops appreciated. Abdomen: Soft, non-tender, non-distended with normoactive bowel sounds. No hepatomegaly. No rebound/guarding. No obvious abdominal masses. Msk:  Strength and tone appear normal for age. Extremities: No clubbing or cyanosis. No edema.  Distal pedal pulses are 2+ and equal bilaterally. Skin: Warm and Dry Neuro: Alert and oriented X 3. CN III-XII intact Grossly normal sensory and motor function . Psych:  Responds to questions appropriately with a normal affect.      Labs: Cardiac Enzymes No results for input(s): CKTOTAL, CKMB, TROPONINI in the last 72 hours. CBC Lab Results  Component Value Date   WBC 17.4 (H) 04/19/2011   HGB 17.6 (H) 04/19/2011   HCT 51.9 04/19/2011   MCV 93.3 04/19/2011   PLT 215.0 04/19/2011   PROTIME: No results for input(s): LABPROT, INR in the last 72 hours. Chemistry No results for input(s): NA, K, CL, CO2, BUN, CREATININE, CALCIUM, PROT, BILITOT, ALKPHOS, ALT, AST, GLUCOSE in the last 168 hours.  Invalid input(s): LABALBU Lipids No results found for: CHOL, HDL, LDLCALC, TRIG BNP No results found for: PROBNP Thyroid Function Tests: No results for input(s): TSH, T4TOTAL, T3FREE, THYROIDAB in the last 72  hours.  Invalid input(s): FREET3 Miscellaneous No results found for: DDIMER  Radiology/Studies:  No results found.  EKG: Afib 72 -/09/38   Assessment and Plan:   Assessment and  Plan Atrial fibrillation - persistent   PVC  Fatigue and daytime somnolence  GE reflux disease  Retrobulbar headache     Patient has persistent atrial fibrillation likely of at least a month and a half duration associated with his palpitations.  It could however be unrelated.  Is appropriate to  anticoagulate him and undertake cardioversion.  He will need anticoagulation for at least a month.  I will reach out to Dr. Lavone Nian concerning the modifying of anticoagulation recommendations based on the presence of his factor V Leiden and is to DVT.  I would be inclined towards anticoagulation indefinitely.  However, given his unusual headache with retrobulbar component in the left ear, will undertake MRI scanning prior to initiation of anticoagulation to exclude the presence of an intracerebral process.  We also need 2D echocardiogram to reevaluate left atrial size and left ventricular function  We will undertake a sleep study.  Appropriate to consider catheter ablation but with persistent atrial fibrillation failure of antiarrhythmic drug therapy is most commonly recommended.  Virl Axe

## 2018-05-24 LAB — BASIC METABOLIC PANEL
BUN / CREAT RATIO: 17 (ref 10–24)
BUN: 20 mg/dL (ref 8–27)
CHLORIDE: 103 mmol/L (ref 96–106)
CO2: 25 mmol/L (ref 20–29)
Calcium: 9.2 mg/dL (ref 8.6–10.2)
Creatinine, Ser: 1.16 mg/dL (ref 0.76–1.27)
GFR calc non Af Amer: 65 mL/min/{1.73_m2} (ref >59)
GFR, EST AFRICAN AMERICAN: 75 mL/min/{1.73_m2} (ref >59)
Glucose: 91 mg/dL (ref 65–99)
POTASSIUM: 4.4 mmol/L (ref 3.5–5.2)
SODIUM: 142 mmol/L (ref 134–144)

## 2018-05-27 ENCOUNTER — Ambulatory Visit (HOSPITAL_COMMUNITY)
Admission: RE | Admit: 2018-05-27 | Discharge: 2018-05-27 | Disposition: A | Discharge status: Home / Self Care | Payer: Medicare Other | Source: Ambulatory Visit | Attending: Internal Medicine | Admitting: Internal Medicine

## 2018-05-27 ENCOUNTER — Telehealth: Payer: Self-pay | Admitting: *Deleted

## 2018-05-27 DIAGNOSIS — R519 Headache, unspecified: Secondary | ICD-10-CM

## 2018-05-27 DIAGNOSIS — R51 Headache: Secondary | ICD-10-CM | POA: Diagnosis not present

## 2018-05-27 MED ORDER — GADOBUTROL 1 MMOL/ML IV SOLN
10.0000 mL | Freq: Once | INTRAVENOUS | Status: AC | PRN
  Administered 2018-05-27: 10 mL via INTRAVENOUS

## 2018-05-27 NOTE — Telephone Encounter (Signed)
Staff message sent to Frank Clark no PA is required. Ok to schedule sleep study.

## 2018-05-27 NOTE — Telephone Encounter (Signed)
-----   Message from Dollene Primrose, RN sent at 05/23/2018  1:22 PM EST ----- Regarding: Split sleep Hello  Pt will need to have a pre auth for a split sleep and be scheduled for one.  Thanks!  [name]

## 2018-05-28 ENCOUNTER — Telehealth: Payer: Self-pay | Admitting: Internal Medicine

## 2018-05-28 DIAGNOSIS — I482 Chronic atrial fibrillation, unspecified: Secondary | ICD-10-CM

## 2018-05-28 NOTE — Telephone Encounter (Signed)
New Message    Patient states she needs the results of the MRI performed to see if husband should start Eliquis or not.

## 2018-05-29 MED ORDER — APIXABAN 5 MG PO TABS: 5.0000 mg | ORAL_TABLET | Freq: Two times a day (BID) | ORAL | 3 refills | Status: AC

## 2018-05-29 NOTE — Telephone Encounter (Signed)
Pt aware of MRI results.  Pt scheduled for DCCV on 2/24 @ 0900 with Dr Ashok Norris. Will clarify NOAC dosage with SK. Pt understands I will call him back with further instructions.

## 2018-05-29 NOTE — Telephone Encounter (Signed)
Reviewed pre procedural instructions with pt. He has verbalized understanding. Pt was instructed to call the office the week before his DCCV if he believed himself to be in NSR. We can arrange for a RN visit for EKG.   Pt understands his Eliquis instructions. He has some concerns with cost, I have advised him to call his insurance company to discuss cost. Pt may have to switch to another NOAC if Eliquis cost remains elevated.   Pt understands he will have an ECHO scheduled for a few weeks after his DCCV. He anticipates a call from scheduling.   Pt has verbalized understanding and will call with any questions or concerns. A copy of his pre DCCV instructions have been sent through [facility].

## 2018-05-29 NOTE — Telephone Encounter (Signed)
LVM for return call to discuss MRI results and instructions regarding NOAC and DCCV.

## 2018-06-02 ENCOUNTER — Telehealth: Payer: Self-pay | Admitting: *Deleted

## 2018-06-02 NOTE — Telephone Encounter (Signed)
Patient is scheduled for lab study on 07/07/18. Patient understands his sleep study will be done at [facility] sleep lab. Patient understands he will receive a sleep packet in a week or so. Patient understands to call if he does not receive the sleep packet in a timely manner. Patient agrees with treatment and thanked me for call.

## 2018-06-02 NOTE — Telephone Encounter (Signed)
-----   Message from Lauralee Evener, [place] sent at 05/27/2018  5:02 PM EST ----- Regarding: RE: Split sleep No PA is required. Ok to schedule sleep study. ----- Message ----- From: Dollene Primrose, RN Sent: 05/23/2018   1:22 PM EST To: [facility] Pre Cert/Auth, # Subject: Split sleep                                    Hello  Pt will need to have a pre auth for a split sleep and be scheduled for one.  Thanks!  [name]

## 2018-06-03 ENCOUNTER — Telehealth: Payer: Self-pay

## 2018-06-03 NOTE — Telephone Encounter (Signed)
We received a PA Request Form for Eliquis via fax from [facility]. Per the form Xarelto is the preferred medication on the pts plan. The form is asking if there is a safety or efficacy concern with changing the pts therapy to Xarelto.  Will forward message to Dr Caryl Comes and his nurse for advisement to the pt.

## 2018-06-05 NOTE — Telephone Encounter (Signed)
-----   Message from Marcine Matar sent at 06/04/2018  9:49 AM EST ----- Regarding: RE: Echo F/u   2.17.2020 Patient has question as to why an echo is order gesila   ----- Message ----- From: Dollene Primrose, RN Sent: 05/29/2018   3:17 PM EST To: Marcine Matar, [facility] Subject: Echo                                           Hello  Could this pt get set up for an echo 1 week post his cardioversion on Feb 24?  Thanks!  Shelsea Hangartner

## 2018-06-05 NOTE — Telephone Encounter (Signed)
LVM for pt regarding his questions about his upcoming Echo and anticoagulation.

## 2018-06-06 NOTE — Telephone Encounter (Signed)
LVM for return call. 

## 2018-06-06 NOTE — Telephone Encounter (Signed)
Per pt and his wife, they are not interested in changing anticoagulants at this time. They will call if they change their mind.  Echo date will also be changed for 2-3 weeks after pt's DCCV.  Pt had no additional questions at this time.

## 2018-06-06 NOTE — Telephone Encounter (Signed)
**Note De-Identified Lyan Holck Obfuscation** This call was started as a PA question for the pts Eliquis. Please address.

## 2018-06-23 ENCOUNTER — Other Ambulatory Visit (HOSPITAL_COMMUNITY): Payer: Medicare Other

## 2018-06-30 ENCOUNTER — Ambulatory Visit (HOSPITAL_COMMUNITY)
Admission: RE | Admit: 2018-06-30 | Discharge: 2018-06-30 | Disposition: A | Discharge status: Home / Self Care | Payer: Medicare Other | Attending: Cardiology | Admitting: Cardiology

## 2018-06-30 ENCOUNTER — Telehealth: Payer: Self-pay | Admitting: Internal Medicine

## 2018-06-30 ENCOUNTER — Encounter (HOSPITAL_COMMUNITY)
Admission: RE | Disposition: A | Discharge status: Home / Self Care | Payer: Self-pay | Source: Home / Self Care | Attending: Cardiology

## 2018-06-30 DIAGNOSIS — Z79899 Other long term (current) drug therapy: Secondary | ICD-10-CM | POA: Diagnosis not present

## 2018-06-30 DIAGNOSIS — I493 Ventricular premature depolarization: Secondary | ICD-10-CM | POA: Diagnosis not present

## 2018-06-30 DIAGNOSIS — R400 Somnolence: Secondary | ICD-10-CM | POA: Insufficient documentation

## 2018-06-30 DIAGNOSIS — Z882 Allergy status to sulfonamides status: Secondary | ICD-10-CM | POA: Diagnosis not present

## 2018-06-30 DIAGNOSIS — R51 Headache: Secondary | ICD-10-CM | POA: Diagnosis not present

## 2018-06-30 DIAGNOSIS — Z87891 Personal history of nicotine dependence: Secondary | ICD-10-CM | POA: Diagnosis not present

## 2018-06-30 DIAGNOSIS — Z7982 Long term (current) use of aspirin: Secondary | ICD-10-CM | POA: Insufficient documentation

## 2018-06-30 DIAGNOSIS — D682 Hereditary deficiency of other clotting factors: Secondary | ICD-10-CM | POA: Diagnosis not present

## 2018-06-30 DIAGNOSIS — Z539 Procedure and treatment not carried out, unspecified reason: Secondary | ICD-10-CM | POA: Insufficient documentation

## 2018-06-30 DIAGNOSIS — K219 Gastro-esophageal reflux disease without esophagitis: Secondary | ICD-10-CM | POA: Diagnosis not present

## 2018-06-30 DIAGNOSIS — I480 Paroxysmal atrial fibrillation: Secondary | ICD-10-CM

## 2018-06-30 DIAGNOSIS — Z86718 Personal history of other venous thrombosis and embolism: Secondary | ICD-10-CM | POA: Diagnosis not present

## 2018-06-30 DIAGNOSIS — I4819 Other persistent atrial fibrillation: Secondary | ICD-10-CM | POA: Insufficient documentation

## 2018-06-30 DIAGNOSIS — R5383 Other fatigue: Secondary | ICD-10-CM | POA: Insufficient documentation

## 2018-06-30 SURGERY — CANCELLED PROCEDURE

## 2018-06-30 NOTE — H&P (Signed)
Patient ID: Frank Clark, MRN: [id number], DOB/AGE: October 13, 1950 68 y.o. Admit date: (Not on file) Date of Consult: 05/23/2018  Primary Physician: Nicoletta Dress, MD Primary Cardiologist: NEW     Frank Clark is a 68 y.o. male who is being seen today for the evaluation of palpitations at the request of Dr. Delena Bali  HPI Frank Clark is a 68 y.o. male seen most recently 2016 with a diagnosis of paroxysmal atrial fibrillation.  He had been managed with cardioversion and nebivolol.  He had been without symptoms until a month or 2 ago when he started noticing some irregularity.  No tachypalpitations.  Minimal exercise intolerance.  No chest pain no swelling no nocturnal dyspnea.  No transient neurological events.  Also complaining for the last month of headaches which are particularly left retrobulbar.  Also some pain in his left ear.     DATE TEST EF    2011 Myoview  No Ischemia    12/12 Echo   60-65 %         Factor V Leiden deficiency. history of a clot in his leg X 2.   . Thromboembolic risk factors ( age -12) for a CHADSVASc Score of 1        Past Medical History:  Diagnosis Date  . Atrial fibrillation (HCC)    assoc with diverticulitis and RMSF  . Complication of anesthesia 02/24/2011   Hard to wake up after Gallbladder  . Diverticulitis   . DVT, lower extremity ([place])    left  . Factor V deficiency ([place])   . PVC (premature ventricular contraction)   . RMSF [facility] spotted fever)   . Shortness of breath       Surgical History:       Past Surgical History:  Procedure Laterality Date  . CHOLECYSTECTOMY    . INGUINAL HERNIA REPAIR     left  . LUMBAR LAMINECTOMY  2005  . TEE WITHOUT CARDIOVERSION  04/23/2011   Procedure: TRANSESOPHAGEAL ECHOCARDIOGRAM (TEE);  Surgeon: Loralie Champagne, MD;  Location: [place];  Service: Cardiovascular;  Laterality: N/A;  . TONSILLECTOMY       Home Meds: ActiveMedications       Current Meds  Medication Sig  . aspirin EC 81 MG tablet Take 1 tablet by mouth daily.  . famotidine (PEPCID AC) 10 MG tablet Take 10 mg by mouth daily as needed for heartburn or indigestion.  . nebivolol (BYSTOLIC) 5 MG tablet Take 1 tablet (5 mg total) by mouth daily.  . traMADol (ULTRAM) 50 MG tablet Take 50 mg by mouth every 6 (six) hours as needed for moderate pain or severe pain.       Allergies:       Allergies  Allergen Reactions  . Sulfonamide Derivatives     REACTION: Reaction not known    Social History        Socioeconomic History  . Marital status: Married    Spouse name: Not on file  . Number of children: Not on file  . Years of education: Not on file  . Highest education level: Not on file  Occupational History  . Occupation: Theme park manager  Social Needs  . Financial resource strain: Not on file  . Food insecurity:    Worry: Not on file    Inability: Not on file  . Transportation needs:    Medical: Not on file    Non-medical: Not on file  Tobacco Use  . Smoking status: Former Smoker    Last  attempt to quit: 1980    Years since quitting: 40.0  . Smokeless tobacco: Never Used  Substance and Sexual Activity  . Alcohol use: No  . Drug use: No  . Sexual activity: Not on file  Lifestyle  . Physical activity:    Days per week: Not on file    Minutes per session: Not on file  . Stress: Not on file  Relationships  . Social connections:    Talks on phone: Not on file    Gets together: Not on file    Attends religious service: Not on file    Active member of club or organization: Not on file    Attends meetings of clubs or organizations: Not on file    Relationship status: Not on file  . Intimate partner violence:    Fear of current or ex partner: Not on file    Emotionally abused: Not on file    Physically abused: Not on file    Forced sexual activity: Not on file  Other Topics Concern  . Not on file  Social  History Narrative  . Not on file          Family History  Problem Relation Age of Onset  . Stroke Father   . Cancer Mother   . Coronary artery disease Brother      ROS:  Please see the history of present illness.     All other systems reviewed and negative.    Physical Exam: Blood pressure 106/80, pulse 62, height 6' (1.829 m), weight 213 lb 12.8 oz (97 kg), SpO2 98 %. General: Well developed, well nourished male in no acute distress. Head: Normocephalic, atraumatic, sclera non-icteric, no xanthomas, nares are without discharge. EENT: normal  Lymph Nodes:  none Neck: Negative for carotid bruits. JVD not elevated. Back:without scoliosis kyphosis Lungs: Clear bilaterally to auscultation without wheezes, rales, or rhonchi. Breathing is unlabored. Heart: Irregular rate and rhythm without murmur . No rubs, or gallops appreciated. Abdomen: Soft, non-tender, non-distended with normoactive bowel sounds. No hepatomegaly. No rebound/guarding. No obvious abdominal masses. Msk:  Strength and tone appear normal for age. Extremities: No clubbing or cyanosis. No edema.  Distal pedal pulses are 2+ and equal bilaterally. Skin: Warm and Dry Neuro: Alert and oriented X 3. CN III-XII intact Grossly normal sensory and motor function . Psych:  Responds to questions appropriately with a normal affect.                 Labs: Cardiac Enzymes RecentLabs(last2labs)  No results for input(s): CKTOTAL, CKMB, TROPONINI in the last 72 hours.   CBC RecentLabs       Lab Results  Component Value Date   WBC 17.4 (H) 04/19/2011   HGB 17.6 (H) 04/19/2011   HCT 51.9 04/19/2011   MCV 93.3 04/19/2011   PLT 215.0 04/19/2011     PROTIME: RecentLabs(last2labs)  No results for input(s): LABPROT, INR in the last 72 hours.   Chemistry  LastLabs  No results for input(s): NA, K, CL, CO2, BUN, CREATININE, CALCIUM, PROT, BILITOT, ALKPHOS, ALT, AST, GLUCOSE in the last 168  hours.  Invalid input(s): LABALBU   Lipids RecentLabs  No results found for: CHOL, HDL, LDLCALC, TRIG   BNP LastLabs  No results found for: PROBNP   Thyroid Function Tests:  RecentLabs(last2labs)  No results for input(s): TSH, T4TOTAL, T3FREE, THYROIDAB in the last 72 hours.  Invalid input(s): FREET3   Miscellaneous RecentLabs  No results found for: DDIMER    Radiology/Studies:  ImagingResults  No results found.    EKG: Afib 72 -/09/38   Assessment and Plan:   Assessment and  Plan Atrial fibrillation - persistent   PVC  Fatigue and daytime somnolence  GE reflux disease  Retrobulbar headache     Patient has persistent atrial fibrillation likely of at least a month and a half duration associated with his palpitations.  It could however be unrelated.  Is appropriate to anticoagulate him and undertake cardioversion.  He will need anticoagulation for at least a month.  I will reach out to Dr. Lavone Nian concerning the modifying of anticoagulation recommendations based on the presence of his factor V Leiden and is to DVT.  I would be inclined towards anticoagulation indefinitely.  However, given his unusual headache with retrobulbar component in the left ear, will undertake MRI scanning prior to initiation of anticoagulation to exclude the presence of an intracerebral process.  We also need 2D echocardiogram to reevaluate left atrial size and left ventricular function  We will undertake a sleep study.  Appropriate to consider catheter ablation but with persistent atrial fibrillation failure of antiarrhythmic drug therapy is most commonly recommended.  Virl Axe        Electronically signed by Deboraha Sprang, MD at 05/23/2018 6:10 PM      Addendum:  Patient arrived in Endo suite today for DCCV and was found to be in NSR.  Cardioversion cancelled and patient sent home to followup with Dr. Caryl Comes.  Signed: Fransico Him, MD [facility]  [facility] 06/30/2018

## 2018-06-30 NOTE — Progress Notes (Signed)
Patient arrived for outpatient cardioversion today, sinus rhythm on monitor, 12 lead EKG reveals same. Call placed to Dr. Radford Pax, verbal order to cancel cardioversion for self conversion. Patient advised of this and understands, discharged to home.

## 2018-06-30 NOTE — Telephone Encounter (Signed)
Went to cardioversion at [facility]. Pt was in normal sinus rhythm so the procedure was not done. Pt was told to call doctor regarding results.  Pt and wife would like to know what else to do control the afib

## 2018-06-30 NOTE — Telephone Encounter (Signed)
Spoke with pt's wife regarding his canceled DCCV. Pt presented in NSR. I advised her to keep her husband on his eliquis and bystolic. He will be scheduling his sleep study in the next week and will have his echo performed. Based off these findings, pt may be referred for AF ablation.   Pt's wife has verbalized understanding and had no additional questions.

## 2018-07-01 ENCOUNTER — Ambulatory Visit (HOSPITAL_COMMUNITY): Payer: Medicare Other | Attending: Cardiovascular Disease

## 2018-07-01 DIAGNOSIS — I482 Chronic atrial fibrillation, unspecified: Secondary | ICD-10-CM | POA: Insufficient documentation

## 2018-07-07 ENCOUNTER — Ambulatory Visit (HOSPITAL_BASED_OUTPATIENT_CLINIC_OR_DEPARTMENT_OTHER): Payer: Medicare Other | Attending: Internal Medicine | Admitting: Cardiology

## 2018-07-07 VITALS — Ht 72.0 in | Wt 220.0 lb

## 2018-07-07 DIAGNOSIS — I480 Paroxysmal atrial fibrillation: Secondary | ICD-10-CM

## 2018-07-07 DIAGNOSIS — G4733 Obstructive sleep apnea (adult) (pediatric): Secondary | ICD-10-CM

## 2018-07-07 DIAGNOSIS — I493 Ventricular premature depolarization: Secondary | ICD-10-CM | POA: Diagnosis not present

## 2018-07-17 ENCOUNTER — Other Ambulatory Visit (HOSPITAL_COMMUNITY): Payer: Medicare Other

## 2018-07-28 ENCOUNTER — Telehealth: Payer: Self-pay | Admitting: *Deleted

## 2018-07-28 NOTE — Telephone Encounter (Signed)
Informed patient of sleep study results and patient understanding was verbalized. Patient understands his sleep study showed no significant sleep apnea.   Pt is aware and agreeable to normal results. 

## 2018-07-28 NOTE — Procedures (Signed)
   Patient Name: Frank Clark, Frank Clark Date:04/23/2017 07/07/2018 Gender: Male D.O.B: [birth year] Age (years): 67 Referring Provider: Virl Axe Height (inches): 38 Interpreting Physician: Fransico Him MD, ABSM Weight (lbs): 220 RPSGT: Lanae Boast BMI: 30 MRN: [id number] Neck Size: 17.50  CLINICAL INFORMATION Sleep Study Type: NPSG  Indication for sleep study: Morning Headaches  Epworth Sleepiness Score: 22  SLEEP STUDY TECHNIQUE As per the AASM Manual for the Scoring of Sleep and Associated Events v2.3 (April 2016) with a hypopnea requiring 4% desaturations.  The channels recorded and monitored were frontal, central and occipital EEG, electrooculogram (EOG), submentalis EMG (chin), nasal and oral airflow, thoracic and abdominal wall motion, anterior tibialis EMG, snore microphone, electrocardiogram, and pulse oximetry.  MEDICATIONS Medications self-administered by patient taken the night of the study : N/A  SLEEP ARCHITECTURE The study was initiated at 9:37:00 PM and ended at 5:15:13 AM.  Sleep onset time was 8.4 minutes and the sleep efficiency was 83.1%. The total sleep time was 381 minutes.  Stage REM latency was 50.5 minutes.  The patient spent 11.2% of the night in stage N1 sleep, 67.7% in stage N2 sleep, 0.0% in stage N3 and 21.1% in REM.  Alpha intrusion was absent.  Supine sleep was 17.32%.  RESPIRATORY PARAMETERS The overall apnea/hypopnea index (AHI) was 3.1 per hour. There were 9 total apneas, including 6 obstructive, 3 central and 0 mixed apneas. There were 11 hypopneas and 14 RERAs.  The AHI during Stage REM sleep was 6.0 per hour.  AHI while supine was 7.3 per hour.  The mean oxygen saturation was 93.0%. The minimum SpO2 during sleep was 85.0%.  moderate snoring was noted during this study.  CARDIAC DATA The 2 lead EKG demonstrated sinus rhythm. The mean heart rate was 65.7 beats per minute. Other EKG findings include: PACs, PVCs.   LEG MOVEMENT DATA The total PLMS were 0 with a resulting PLMS index of 0.0. Associated arousal with leg movement index was 0.3 .  IMPRESSIONS - No significant obstructive sleep apnea occurred during this study (AHI = 3.1/h). - No significant central sleep apnea occurred during this study (CAI = 0.5/h). - Mild oxygen desaturation was noted during this study (Min O2 = 85.0%). - The patient snored with moderate snoring volume. - EKG findings include PACs, PVCs. - Clinically significant periodic limb movements did not occur during sleep. No significant associated arousals.  DIAGNOSIS - Normal Study  RECOMMENDATIONS - Avoid alcohol, sedatives and other CNS depressants that may worsen sleep apnea and disrupt normal sleep architecture. - Sleep hygiene should be reviewed to assess factors that may improve sleep quality. - Weight management and regular exercise should be initiated or continued if appropriate.  [Electronically signed] 07/28/2018 01:05 PM  Fransico Him MD, ABSM Diplomate, American Board of Sleep Medicine

## 2018-07-28 NOTE — Telephone Encounter (Signed)
-----   Message from Sueanne Margarita, MD sent at 07/28/2018  1:08 PM EDT ----- Please let patient know that sleep study showed no significant sleep apnea.

## 2018-10-07 DIAGNOSIS — Z1331 Encounter for screening for depression: Secondary | ICD-10-CM | POA: Diagnosis not present

## 2018-10-07 DIAGNOSIS — E785 Hyperlipidemia, unspecified: Secondary | ICD-10-CM | POA: Diagnosis not present

## 2018-10-07 DIAGNOSIS — K219 Gastro-esophageal reflux disease without esophagitis: Secondary | ICD-10-CM | POA: Diagnosis not present

## 2018-10-07 DIAGNOSIS — Z9181 History of falling: Secondary | ICD-10-CM | POA: Diagnosis not present

## 2018-10-07 DIAGNOSIS — Z125 Encounter for screening for malignant neoplasm of prostate: Secondary | ICD-10-CM | POA: Diagnosis not present

## 2018-10-07 DIAGNOSIS — I480 Paroxysmal atrial fibrillation: Secondary | ICD-10-CM | POA: Diagnosis not present

## 2018-10-09 DIAGNOSIS — Z87891 Personal history of nicotine dependence: Secondary | ICD-10-CM | POA: Diagnosis not present

## 2018-10-09 DIAGNOSIS — M5416 Radiculopathy, lumbar region: Secondary | ICD-10-CM | POA: Diagnosis not present

## 2018-10-09 DIAGNOSIS — M5116 Intervertebral disc disorders with radiculopathy, lumbar region: Secondary | ICD-10-CM | POA: Diagnosis not present

## 2018-10-16 ENCOUNTER — Telehealth: Payer: Self-pay | Admitting: Internal Medicine

## 2018-10-16 NOTE — Telephone Encounter (Signed)
Pt's wife calling requesting samples of Eliquis, stating that pt's medication is too expensive. I asked the wife if they would be interested in applying for patient assistance, wife stated that they would be. Patricia,LPN could you please look at this for me and advise what we need to do for this pt. Please address

## 2018-10-16 NOTE — Telephone Encounter (Signed)
**Note De-Identified Frank Clark Obfuscation** I called [facility] and was advised that the cost of Eliquis is $563/30 day supply to the pt and that they received a message when RX was ran that Eliquis is not on the plans formulary.  The pts wife Frank Clark is aware that I am going to do a Formulary exception on the pts Eliquis to try to lower the cost and that if I am not successful I will contact her and advise on how to apply for pt asst through [employer].  Also, she is aaware that we are leaving the pt 2 bottles of Eliquis 5 mg samples in the downstairs lobby at the [facility] office at [facility] in [place] to be picked up by the pts son.

## 2018-10-17 DIAGNOSIS — Z87891 Personal history of nicotine dependence: Secondary | ICD-10-CM | POA: Diagnosis not present

## 2018-10-17 DIAGNOSIS — M4726 Other spondylosis with radiculopathy, lumbar region: Secondary | ICD-10-CM | POA: Diagnosis not present

## 2018-10-17 DIAGNOSIS — M5126 Other intervertebral disc displacement, lumbar region: Secondary | ICD-10-CM | POA: Diagnosis not present

## 2018-10-17 DIAGNOSIS — M4727 Other spondylosis with radiculopathy, lumbosacral region: Secondary | ICD-10-CM | POA: Diagnosis not present

## 2018-10-17 DIAGNOSIS — M545 Low back pain: Secondary | ICD-10-CM | POA: Diagnosis not present

## 2018-10-17 DIAGNOSIS — M5116 Intervertebral disc disorders with radiculopathy, lumbar region: Secondary | ICD-10-CM | POA: Diagnosis not present

## 2018-10-17 DIAGNOSIS — M5416 Radiculopathy, lumbar region: Secondary | ICD-10-CM | POA: Diagnosis not present

## 2018-10-17 DIAGNOSIS — M48061 Spinal stenosis, lumbar region without neurogenic claudication: Secondary | ICD-10-CM | POA: Diagnosis not present

## 2018-10-17 NOTE — Telephone Encounter (Signed)
**Note De-Identified Chelci [place] Obfuscation** I have done a non-formulary exception on the pts Eliquis through covermymeds. Key: [id number]

## 2018-10-20 ENCOUNTER — Telehealth: Payer: Self-pay

## 2018-10-20 NOTE — Telephone Encounter (Signed)
**Note De-Identified Sabre Leonetti Obfuscation** The pts wife Thayer Headings is advised that we received a letter from [facility] stating that they approved the pts Eliquis formulary exception and that I have called [facility] and was advised that now the cost will be $456/30 day supply which will save the pt $107 a month.  Thayer Headings states that this is still to expensive. I advised her to call [employer] to see is there is anything they can do to lower the cost of Eliquis or if the pt should switch to another anticoagulant that is less expensive. I also gave her the phone number to reach out to [facility] to see if they can assist with the cost.  She states that she will call both places and thanked me for letting her know the outcome of the Eliquis formulary exception.

## 2018-10-20 NOTE — Telephone Encounter (Signed)
   [facility] Pre-operative Risk Assessment    Request for surgical clearance:  1. What type of surgery is being performed? EPIDURAL INJECTION  2. When is this surgery scheduled? TBD   3. Are there any medications that need to be held prior to surgery and how long? ELIQUIS FOR 3-5 DAYS   4. Practice name and name of physician performing surgery? [facility]; DR Davis Gourd   5. What is your office phone and fax number? [phone] [phone]   6. Anesthesia type (None, local, MAC, general) ? LOCAL   Frank Clark 10/20/2018, 3:50 PM  _________________________________________________________________   (provider comments below)

## 2018-10-21 NOTE — Telephone Encounter (Signed)
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 He was not previously on long term anticoag for his Factor V Leiden. Started on Eliquis in January 2020 for afib. DCCV scheduled in February 2020 but canceled since pt was in NSR. Renal function is normal. Ok to hold Eliquis for 3 days prior to spinal procedure.

## 2018-10-27 NOTE — Telephone Encounter (Signed)
   Primary Cardiologist: Dr. Caryl Comes  Chart reviewed as part of pre-operative protocol coverage.   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 He was not previously on long term anticoag for his Factor V Leiden. Started on Eliquis in January 2020 for afib. DCCV scheduled in February 2020 but canceled since pt was in NSR. Renal function is normal. Ok to hold Eliquis for 3 days prior to spinal procedure.        I reviewed this with the patient and he will stop his Eliquis as of this evening's dose.   Will route this recommendation to requesting provider via Epic fax function. Please call with questions.  Daune Perch, NP 10/27/2018, 5:23 PM

## 2018-10-27 NOTE — Telephone Encounter (Signed)
Patient called, he stated his injection is this Thursday, 10/30/18. He wants to know if it's ok to hold Eliquis.

## 2018-10-28 DIAGNOSIS — E785 Hyperlipidemia, unspecified: Secondary | ICD-10-CM | POA: Diagnosis not present

## 2018-10-28 DIAGNOSIS — Z9181 History of falling: Secondary | ICD-10-CM | POA: Diagnosis not present

## 2018-10-28 DIAGNOSIS — Z125 Encounter for screening for malignant neoplasm of prostate: Secondary | ICD-10-CM | POA: Diagnosis not present

## 2018-10-28 DIAGNOSIS — Z1211 Encounter for screening for malignant neoplasm of colon: Secondary | ICD-10-CM | POA: Diagnosis not present

## 2018-10-28 DIAGNOSIS — Z1331 Encounter for screening for depression: Secondary | ICD-10-CM | POA: Diagnosis not present

## 2018-10-28 DIAGNOSIS — Z0000 Encounter for general adult medical examination without abnormal findings: Secondary | ICD-10-CM | POA: Diagnosis not present

## 2018-10-30 DIAGNOSIS — M5416 Radiculopathy, lumbar region: Secondary | ICD-10-CM | POA: Diagnosis not present

## 2018-10-30 DIAGNOSIS — Z87891 Personal history of nicotine dependence: Secondary | ICD-10-CM | POA: Diagnosis not present

## 2018-10-30 DIAGNOSIS — Z7901 Long term (current) use of anticoagulants: Secondary | ICD-10-CM | POA: Diagnosis not present

## 2018-10-30 DIAGNOSIS — M5126 Other intervertebral disc displacement, lumbar region: Secondary | ICD-10-CM | POA: Diagnosis not present

## 2018-10-30 DIAGNOSIS — M545 Low back pain: Secondary | ICD-10-CM | POA: Diagnosis not present

## 2018-12-12 DIAGNOSIS — M545 Low back pain: Secondary | ICD-10-CM | POA: Diagnosis not present

## 2018-12-12 DIAGNOSIS — M5441 Lumbago with sciatica, right side: Secondary | ICD-10-CM | POA: Diagnosis not present

## 2018-12-12 DIAGNOSIS — M48061 Spinal stenosis, lumbar region without neurogenic claudication: Secondary | ICD-10-CM | POA: Diagnosis not present

## 2018-12-12 DIAGNOSIS — M546 Pain in thoracic spine: Secondary | ICD-10-CM | POA: Diagnosis not present

## 2018-12-12 DIAGNOSIS — M5416 Radiculopathy, lumbar region: Secondary | ICD-10-CM | POA: Diagnosis not present

## 2018-12-12 DIAGNOSIS — M47814 Spondylosis without myelopathy or radiculopathy, thoracic region: Secondary | ICD-10-CM | POA: Diagnosis not present

## 2018-12-16 DIAGNOSIS — M5126 Other intervertebral disc displacement, lumbar region: Secondary | ICD-10-CM | POA: Diagnosis not present

## 2018-12-16 DIAGNOSIS — Z87891 Personal history of nicotine dependence: Secondary | ICD-10-CM | POA: Diagnosis not present

## 2018-12-16 DIAGNOSIS — M5416 Radiculopathy, lumbar region: Secondary | ICD-10-CM | POA: Diagnosis not present

## 2018-12-16 DIAGNOSIS — Z7901 Long term (current) use of anticoagulants: Secondary | ICD-10-CM | POA: Diagnosis not present

## 2018-12-16 DIAGNOSIS — M545 Low back pain: Secondary | ICD-10-CM | POA: Diagnosis not present

## 2019-01-28 DIAGNOSIS — Z23 Encounter for immunization: Secondary | ICD-10-CM | POA: Diagnosis not present

## 2019-05-17 NOTE — Progress Notes (Deleted)
05/17/2019 Frank Clark [id number] 07-Mar-1951   HISTORY OF PRESENT ILLNESS:  Frank Clark is a 69 year old male with a past medical history of atrial fibrillation on Eliquis, LLE DVT, Factor V Leiden deficiency, sigmoid diverticulitis with a small abscess and perforation 12/2016. Surgical intervention was not required.  Past cholecystectomy and inguinal hernia surgery.    Abdominal/pelvic CT 12/13/2016: CT ABDOMEN PELVIS W CONTRAST  Impression 1. Extraluminal gas adjacent to the distal sigmoid colon has slightly decreased in amount. However, there is a significant increase in free intraperitoneal air since the prior study. 2. Small fluid collection right side of the pelvis measuring about 2.5 x 1.5 cm. This is likely an early small abscess. 3. No evidence of bowel obstruction. 4. Right renal cyst.  Discharged home on Augmentin 875mg  po bid x 10 days.   Abdominal/pelvic CT 12/19/2017  1.  No acute inflammatory process within the abdomen or pelvis. 2. Moderate colonic diverticulosis without diverticulitis. 3. Cholecystectomy. 4. Hepatic steatosis. 5. Normal appendix.  Past Medical History:  Diagnosis Date  . Atrial fibrillation (HCC)    assoc with diverticulitis and RMSF  . Complication of anesthesia 02/24/2011   Hard to wake up after Gallbladder  . Diverticulitis   . DVT, lower extremity ([place])    left  . Factor V deficiency ([place])   . PVC (premature ventricular contraction)   . RMSF [facility] spotted fever)   . Shortness of breath    Past Surgical History:  Procedure Laterality Date  . CHOLECYSTECTOMY    . INGUINAL HERNIA REPAIR     left  . LUMBAR LAMINECTOMY  2005  . TEE WITHOUT CARDIOVERSION  04/23/2011   Procedure: TRANSESOPHAGEAL ECHOCARDIOGRAM (TEE);  Surgeon: Loralie Champagne, MD;  Location: [place];  Service: Cardiovascular;  Laterality: N/A;  . TONSILLECTOMY      reports that he quit smoking about 41 years ago. He has never used smokeless  tobacco. He reports that he does not drink alcohol or use drugs. family history includes Cancer in his mother; Coronary artery disease in his brother; Stroke in his father. Allergies  Allergen Reactions  . Sulfonamide Derivatives Itching and Rash    REACTION: Reaction not know      Outpatient Encounter Medications as of 05/18/2019  Medication Sig  . apixaban (ELIQUIS) 5 MG TABS tablet Take 1 tablet (5 mg total) by mouth 2 (two) times daily.  . famotidine (PEPCID AC) 10 MG tablet Take 10 mg by mouth daily as needed for heartburn or indigestion.  . nebivolol (BYSTOLIC) 5 MG tablet Take 1 tablet (5 mg total) by mouth daily. (Patient taking differently: Take 5 mg by mouth at bedtime. )  . Phenylephrine-Acetaminophen (TYLENOL SINUS+HEADACHE PO) Take 2 tablets by mouth 2 (two) times daily as needed (sinus headaches).  . traMADol (ULTRAM) 50 MG tablet Take 50 mg by mouth every 6 (six) hours as needed for moderate pain or severe pain.    No facility-administered encounter medications on file as of 05/18/2019.     REVIEW OF SYSTEMS  : All other systems reviewed and negative except where noted in the History of Present Illness.   PHYSICAL EXAM: There were no vitals taken for this visit. General: Well developed 69 year old male in no acute distress. Head: Normocephalic and atraumatic. Eyes:  sclerae anicteric,conjunctive pink. Ears: Normal auditory acuity. Neck: Supple, no thyromegaly or lymphadenopathy.   Lungs: Clear throughout to auscultation Heart: Regular rate and rhythm Abdomen: Soft, nontender, non distended. No masses  or hepatomegaly noted. Normal bowel sounds x 4 quadrants.  Rectal: Musculoskeletal: Symmetrical with no gross deformities  Skin: No lesions on visible extremities Extremities: No edema. No cyanosis or clubbing.  Neurological: Alert oriented x 4, grossly nonfocal Psychological:  Alert and cooperative. Normal mood and affect  ASSESSMENT AND PLAN:    CC:  Nicoletta Dress, MD

## 2019-05-18 ENCOUNTER — Ambulatory Visit: Payer: Medicare Other | Admitting: Nurse Practitioner

## 2019-05-24 NOTE — Progress Notes (Signed)
05/24/2019 Frank Clark [id number] [birth year]   HISTORY OF PRESENT ILLNESS:  Frank Clark a 69 year old male with a past medical history of paroxysmal atrial fibrillation, DVT x 2, Leiden factor V deficiency on Eliqis.  History of diverticulitis with a perforation 12/2016 treated with IV antibiotics and bowel rest without surgical intervention. Past cholecystectomy, hernia repair, tonsillectomy and lumbar fusion. He presents today for further evaluation for heartburn and dysphagia. He complains of having acid secretions in his mouth and throat which is worse at night time. He complains of a "greasy" taste in his mouth and to the back of his throat. Food such as steak or rice will briefly get stuck to his upper esophagus which occurs once every few months for the past 2 to 3 years. He drinks water and the stuck food passes down the esophagus.  His mouth is often dry. He was prescribed Pantoprazole 40mg  QD by his PCP but he has not started this medication yet. He reported having headaches associated with PPI use.  He cannot recall if he took Pantoprazole in the past. He is taking Pepcid AC 10mg  one tab at bed time with some improvement. He reported having an EGD in the past and his esophagus was stretched, he cannot recall when the EGD was done. History of recurrent diverticulitis. His first episode of diverticulitis was in 2015 or 2016. The second episode of diverticulitis was [id number] which was complicated by a perforation as documented above. Since then, he reports having 3 episodes of diverticulitis "back to back" spring into June 2019 which were treated by his surgeon, Dr. Davis Gourd, at [facility].  He was prescribed Cipro and Flagyl but his LLQ pain did not completely resolve so he was then prescribed Augmentin 875mg  1 po bid x 10 days 10/2017. He denies having any other flares of diverticulitis since that time. He denies having any current abdominal pain. He is passing a normal formed brown bowel  movement once daily. However, he had loose stools for 1 or 2 days last week after he took Augmentin and Prednisone for a sinus infection. No rectal bleeding or melena. He started taking a probiotic one week ago and his diarrhea abated. His last colonoscopy was approximately 10 years ago. He stated a few benign polyps were removed. No family history of esophageal, gastric or colorectal cancer. Mother died in her 71's secondary to cancer to her spine. Father died in his 86's from a stroke. He has gained 30lbs over the past year. Past smoker, quit 1971. No alcohol use. No other complaints today.   Labs 05/23/2018: Na 142. K 4.4. Glu 91. BUN 20. Cr. 1.16.   ECHO 07/01/2018: 1. The left ventricle has normal systolic function, with an ejection fraction of 55-60%. The cavity size was normal. Left ventricular diastolic parameters were normal.  2. The right ventricle has normal systolic function. The cavity was normal. There is no increase in right ventricular wall thickness.  3. The mitral valve is normal in structure.  4. The tricuspid valve is normal in structure.  5. The aortic valve is normal in structure.  6. The pulmonic valve was normal in structure.  Abdominal/pelvic CT 12/13/2016: 1. Abnormal collection of extraluminal air in the right pelvis. This is adjacent to the sigmoid colon and is associated with stranding of fat. Perforated diverticulitis is the most likely etiology. 2. Simple and complex cysts of the right kidney. 3. Stable small hepatic cyst. Prior cholecystectomy. 4. Mild bibasilar atelectasis.  Past  Medical History:  Diagnosis Date  . Atrial fibrillation (HCC)    assoc with diverticulitis and RMSF  . Complication of anesthesia 02/24/2011   Hard to wake up after Gallbladder  . Diverticulitis   . DVT, lower extremity ([place])    left  . Factor V deficiency ([place])   . PVC (premature ventricular contraction)   . RMSF [facility] spotted fever)   . Shortness of breath    Past  Surgical History:  Procedure Laterality Date  . CHOLECYSTECTOMY    . INGUINAL HERNIA REPAIR     left  . LUMBAR LAMINECTOMY  2005  . TEE WITHOUT CARDIOVERSION  04/23/2011   Procedure: TRANSESOPHAGEAL ECHOCARDIOGRAM (TEE);  Surgeon: Loralie Champagne, MD;  Location: [facility];  Service: Cardiovascular;  Laterality: N/A;  . TONSILLECTOMY      reports that he quit smoking about 41 years ago. He has never used smokeless tobacco. He reports that he does not drink alcohol or use drugs. family history includes Cancer in his mother; Coronary artery disease in his brother; Stroke in his father. Allergies  Allergen Reactions  . Sulfonamide Derivatives Itching and Rash    REACTION: Reaction not know      Outpatient Encounter Medications as of 05/25/2019  Medication Sig  . apixaban (ELIQUIS) 5 MG TABS tablet Take 1 tablet (5 mg total) by mouth 2 (two) times daily.  . famotidine (PEPCID AC) 10 MG tablet Take 10 mg by mouth daily as needed for heartburn or indigestion.  . nebivolol (BYSTOLIC) 5 MG tablet Take 1 tablet (5 mg total) by mouth daily. (Patient taking differently: Take 5 mg by mouth at bedtime. )  . Phenylephrine-Acetaminophen (TYLENOL SINUS+HEADACHE PO) Take 2 tablets by mouth 2 (two) times daily as needed (sinus headaches).  . traMADol (ULTRAM) 50 MG tablet Take 50 mg by mouth every 6 (six) hours as needed for moderate pain or severe pain.    No facility-administered encounter medications on file as of 05/25/2019.     REVIEW OF SYSTEMS: Gen: Denies fever, sweats or chills. No weight loss.  CV: Denies chest pain, palpitations or edema. Resp: Denies cough, shortness of breath of hemoptysis.  GI: See HPI.  GU : Denies urinary burning, blood in urine, increased urinary frequency or incontinence. MS: Denies joint pain, muscles aches or weakness. Derm: Denies rash, itchiness, skin lesions or unhealing ulcers. Psych: Denies depression, anxiety, memory loss, suicidal ideation and  confusion. Heme: Denies bruising, bleeding. Neuro:  Denies headaches, dizziness or paresthesias. Endo:  Denies any problems with DM, thyroid or adrenal function.  PHYSICAL EXAM: BP 126/74   Pulse 68   Temp 97.9 F (36.6 C)   Ht 6' (1.829 m)   Wt 230 lb (104.3 kg)   BMI 31.19 kg/m  General: Well developed  69 year old male in no acute distress. Head: Normocephalic and atraumatic. Eyes:  Sclerae non-icteric, conjunctive pink. Ears: Normal auditory acuity. Neck: Supple, no lymphadenopathy or thyromegaly.  Lungs: Clear bilaterally to auscultation without wheezes, crackles or rhonchi. Heart: Regular rate and rhythm. Distant S1,S2. No murmur, rub or gallop appreciated.  Abdomen: Soft, nontender, non distended. No masses. No hepatosplenomegaly. Normoactive bowel sounds x 4 quadrants. Moderate Diastasis recti. Laparoscopic scars intact.  Rectal: Deferred. Musculoskeletal: Symmetrical with no gross deformities. Skin: Warm and dry. No rash or lesions on visible extremities. Extremities: No edema, L calf > R. Neurological: Alert oriented x 4, no focal deficits.  Psychological:  Alert and cooperative. Normal mood and affect.  ASSESSMENT AND PLAN:  55. 69 year old male with GERD and dysphagia  -EGD benefits and risks discussed including risks with sedation, risk of bleeding, infection and perforation  -Ok to start Pantoprazole 40mg  po QD as prescribed PCP, to stop if he develops headaches. -Pepcid AC 10mg  one to 2 tabs at bed time.  -GERD handout, avoid eating food late at night  2. History of sigmoid diverticulitis with perforation 2018 with recurrent diverticulitis 10/2017 which required a prolonged course of antibiotics (Cipro/Flagyl then Augmentin). Patient reports prior history of colon polyps. His one and only colonoscopy was done 10 years ago. -Colonoscopy benefits and risks discussed  including risks with sedation, risk of bleeding, infection and perforation   3. History of  paroxysmal atrial fibrillation, DVT x 2 and Leiden factor V deficiency on Eliquis. Thromboembolic risk factors ( age -67) for a CHADSVASc Score of1. -our office will contact the patient's cardiologist, Frank Clark with [facility] cardiology to verify Eliquis instructions and possible Lovenox bridge prior to EGD/colonoscopy   Further follow-up to be determined after EGD and colonoscopy completed    CC:  Frank Dress, MD

## 2019-05-25 ENCOUNTER — Ambulatory Visit (INDEPENDENT_AMBULATORY_CARE_PROVIDER_SITE_OTHER): Payer: Medicare Other | Admitting: Nurse Practitioner

## 2019-05-25 ENCOUNTER — Other Ambulatory Visit: Payer: Self-pay

## 2019-05-25 ENCOUNTER — Telehealth: Payer: Self-pay | Admitting: General Surgery

## 2019-05-25 ENCOUNTER — Encounter: Payer: Self-pay | Admitting: Nurse Practitioner

## 2019-05-25 VITALS — BP 126/74 | HR 68 | Temp 97.9°F | Ht 72.0 in | Wt 230.0 lb

## 2019-05-25 DIAGNOSIS — R1310 Dysphagia, unspecified: Secondary | ICD-10-CM | POA: Insufficient documentation

## 2019-05-25 DIAGNOSIS — K219 Gastro-esophageal reflux disease without esophagitis: Secondary | ICD-10-CM | POA: Diagnosis not present

## 2019-05-25 DIAGNOSIS — Z86010 Personal history of colonic polyps: Secondary | ICD-10-CM

## 2019-05-25 DIAGNOSIS — R1319 Other dysphagia: Secondary | ICD-10-CM

## 2019-05-25 DIAGNOSIS — Z8719 Personal history of other diseases of the digestive system: Secondary | ICD-10-CM | POA: Diagnosis not present

## 2019-05-25 DIAGNOSIS — D6851 Activated protein C resistance: Secondary | ICD-10-CM | POA: Diagnosis not present

## 2019-05-25 DIAGNOSIS — Z01818 Encounter for other preprocedural examination: Secondary | ICD-10-CM

## 2019-05-25 MED ORDER — CLENPIQ 10-3.5-12 MG-GM -GM/160ML PO SOLN: 1.0000 | ORAL | 0 refills | Status: DC

## 2019-05-25 NOTE — Telephone Encounter (Signed)
Notified the patient to not take his Eliquis one day prior to his procedure. The patient verbalized understanding.

## 2019-05-25 NOTE — Telephone Encounter (Signed)
[facility] Pre-operative Risk Assessment     Request for surgical clearance:     Endoscopy Procedure  What type of surgery is being performed?     Colonoscopy/Endoscopy  When is this surgery scheduled?     06/03/2019  What type of clearance is required ?   Pharmacy  Are there any medications that need to be held prior to surgery and how long? Eliquis for 3 days  Practice name and name of physician performing surgery?      [facility]  What is your office phone and fax number?      Phone- [phone]  [phone]  Anesthesia type (None, local, MAC, general) ?       MAC

## 2019-05-25 NOTE — Telephone Encounter (Signed)
   Primary Cardiologist: No primary care provider on file.  Chart reviewed as part of pre-operative protocol coverage. Given past medical history and time since last visit, based on ACC/AHA guidelines, Harvel Eisenman would be at acceptable risk for the planned procedure without further cardiovascular testing.   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 He was not previously on long term anticoag for his Factor V Leiden. Started on Eliquis in January 2020 for afib. DCCV scheduled in February 2020 but canceled since pt was in NSR. Renal function is normal.  Based on patients history of 2 DVT's would recommend patient hold Eliquis only 1 day prior to procedure  I will route this recommendation to the requesting party via [facility] fax function and remove from pre-op pool.  Please call with questions.  Kathyrn Drown, NP 05/25/2019, 2:19 PM

## 2019-05-25 NOTE — Progress Notes (Signed)
Agree with the assessment and plan as outlined by Colleen Kennedy-Smith, NP.   

## 2019-05-25 NOTE — Patient Instructions (Addendum)
If you are age 68 or older, your body mass index should be between 23-30. Your Body mass index is 31.19 kg/m. If this is out of the aforementioned range listed, please consider follow up with your Primary Care Provider.  If you are age 75 or younger, your body mass index should be between 19-25. Your Body mass index is 31.19 kg/m. If this is out of the aformentioned range listed, please consider follow up with your Primary Care Provider.   We have sent the following medications to your pharmacy for you to pick up at your convenience:  Clenpiq  Please contunue with your pantoprazole 40 mg to be taken 30 minutes before breakfast.  Start taking over the counter Pepcid AC 10 mg 1 at bedtime. If your pantoprazole causes a headache stop taking it and use Pepcid twice a day.  You will be contaced by our office prior to your procedure for directions on holding yourEliquis.  If you do not hear from our office 1 week prior to your scheduled procedure, please call [phone] to discuss. Gastroesophageal Reflux Disease, Adult Gastroesophageal reflux (GER) happens when acid from the stomach flows up into the tube that connects the mouth and the stomach (esophagus). Normally, food travels down the esophagus and stays in the stomach to be digested. With GER, food and stomach acid sometimes move back up into the esophagus. You may have a disease called gastroesophageal reflux disease (GERD) if the reflux:  Happens often.  Causes frequent or very bad symptoms.  Causes problems such as damage to the esophagus. When this happens, the esophagus becomes sore and swollen (inflamed). Over time, GERD can make small holes (ulcers) in the lining of the esophagus. What are the causes? This condition is caused by a problem with the muscle between the esophagus and the stomach. When this muscle is weak or not normal, it does not close properly to keep food and acid from coming back up from the stomach. The muscle can  be weak because of:  Tobacco use.  Pregnancy.  Having a certain type of hernia (hiatal hernia).  Alcohol use.  Certain foods and drinks, such as coffee, chocolate, onions, and peppermint. What increases the risk? You are more likely to develop this condition if you:  Are overweight.  Have a disease that affects your connective tissue.  Use NSAID medicines. What are the signs or symptoms? Symptoms of this condition include:  Heartburn.  Difficult or painful swallowing.  The feeling of having a lump in the throat.  A bitter taste in the mouth.  Bad breath.  Having a lot of saliva.  Having an upset or bloated stomach.  Belching.  Chest pain. Different conditions can cause chest pain. Make sure you see your doctor if you have chest pain.  Shortness of breath or noisy breathing (wheezing).  Ongoing (chronic) cough or a cough at night.  Wearing away of the surface of teeth (tooth enamel).  Weight loss. How is this treated? Treatment will depend on how bad your symptoms are. Your doctor may suggest:  Changes to your diet.  Medicine.  Surgery. Follow these instructions at home: Eating and drinking   Follow a diet as told by your doctor. You may need to avoid foods and drinks such as: ? Coffee and tea (with or without caffeine). ? Drinks that contain alcohol. ? Energy drinks and sports drinks. ? Bubbly (carbonated) drinks or sodas. ? Chocolate and cocoa. ? Peppermint and mint flavorings. ? Garlic and onions. ?  Horseradish. ? Spicy and acidic foods. These include peppers, chili powder, curry powder, vinegar, hot sauces, and BBQ sauce. ? Citrus fruit juices and citrus fruits, such as oranges, lemons, and limes. ? Tomato-based foods. These include red sauce, chili, salsa, and pizza with red sauce. ? Fried and fatty foods. These include donuts, french fries, potato chips, and high-fat dressings. ? High-fat meats. These include hot dogs, rib eye steak,  sausage, ham, and bacon. ? High-fat dairy items, such as whole milk, butter, and cream cheese.  Eat small meals often. Avoid eating large meals.  Avoid drinking large amounts of liquid with your meals.  Avoid eating meals during the 2-3 hours before bedtime.  Avoid lying down right after you eat.  Do not exercise right after you eat. Lifestyle   Do not use any products that contain nicotine or tobacco. These include cigarettes, e-cigarettes, and chewing tobacco. If you need help quitting, ask your doctor.  Try to lower your stress. If you need help doing this, ask your doctor.  If you are overweight, lose an amount of weight that is healthy for you. Ask your doctor about a safe weight loss goal. General instructions  Pay attention to any changes in your symptoms.  Take over-the-counter and prescription medicines only as told by your doctor. Do not take aspirin, ibuprofen, or other NSAIDs unless your doctor says it is okay.  Wear loose clothes. Do not wear anything tight around your waist.  Raise (elevate) the head of your bed about 6 inches (15 cm).  Avoid bending over if this makes your symptoms worse.  Keep all follow-up visits as told by your doctor. This is important. Contact a doctor if:  You have new symptoms.  You lose weight and you do not know why.  You have trouble swallowing or it hurts to swallow.  You have wheezing or a cough that keeps happening.  Your symptoms do not get better with treatment.  You have a hoarse voice. Get help right away if:  You have pain in your arms, neck, jaw, teeth, or back.  You feel sweaty, dizzy, or light-headed.  You have chest pain or shortness of breath.  You throw up (vomit) and your throw-up looks like blood or coffee grounds.  You pass out (faint).  Your poop (stool) is bloody or black.  You cannot swallow, drink, or eat. Summary  If a person has gastroesophageal reflux disease (GERD), food and stomach acid  move back up into the esophagus and cause symptoms or problems such as damage to the esophagus.  Treatment will depend on how bad your symptoms are.  Follow a diet as told by your doctor.  Take all medicines only as told by your doctor. This information is not intended to replace advice given to you by your health care provider. Make sure you discuss any questions you have with your health care provider. Document Revised: 10/30/2017 Document Reviewed: 10/30/2017 Elsevier Patient Education  [employer].    Due to recent changes in healthcare laws, you may see the results of your imaging and laboratory studies on MyChart before your provider has had a chance to review them.  We understand that in some cases there may be results that are confusing or concerning to you. Not all laboratory results come back in the same time frame and the provider may be waiting for multiple results in order to interpret others.  Please give [place] 48 hours in order for your provider to thoroughly review  all the results before contacting the office for clarification of your results.

## 2019-05-25 NOTE — Telephone Encounter (Signed)
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 He was not previously on long term anticoag for his Factor V Leiden. Started on Eliquis in January 2020 for afib. DCCV scheduled in February 2020 but canceled since pt was in NSR. Renal function is normal.  Based on patients history of 2 DVT's would recommend patient hold Eliquis only 1 day prior to procedure

## 2019-06-01 ENCOUNTER — Other Ambulatory Visit: Payer: Self-pay | Admitting: Gastroenterology

## 2019-06-01 ENCOUNTER — Ambulatory Visit (INDEPENDENT_AMBULATORY_CARE_PROVIDER_SITE_OTHER): Payer: Medicare Other

## 2019-06-01 DIAGNOSIS — Z1159 Encounter for screening for other viral diseases: Secondary | ICD-10-CM

## 2019-06-01 LAB — SARS CORONAVIRUS 2 (TAT 6-24 HRS): SARS Coronavirus 2: NEGATIVE

## 2019-06-03 ENCOUNTER — Encounter: Payer: Self-pay | Admitting: Gastroenterology

## 2019-06-03 ENCOUNTER — Ambulatory Visit (AMBULATORY_SURGERY_CENTER): Payer: Medicare Other | Admitting: Gastroenterology

## 2019-06-03 ENCOUNTER — Other Ambulatory Visit: Payer: Self-pay

## 2019-06-03 VITALS — BP 106/50 | HR 68 | Temp 97.5°F | Resp 12 | Ht 72.0 in | Wt 230.0 lb

## 2019-06-03 DIAGNOSIS — K219 Gastro-esophageal reflux disease without esophagitis: Secondary | ICD-10-CM | POA: Diagnosis not present

## 2019-06-03 DIAGNOSIS — R1319 Other dysphagia: Secondary | ICD-10-CM | POA: Diagnosis not present

## 2019-06-03 DIAGNOSIS — D120 Benign neoplasm of cecum: Secondary | ICD-10-CM

## 2019-06-03 DIAGNOSIS — K2950 Unspecified chronic gastritis without bleeding: Secondary | ICD-10-CM

## 2019-06-03 DIAGNOSIS — Z860100 Personal history of colon polyps, unspecified: Secondary | ICD-10-CM

## 2019-06-03 DIAGNOSIS — D128 Benign neoplasm of rectum: Secondary | ICD-10-CM

## 2019-06-03 DIAGNOSIS — K640 First degree hemorrhoids: Secondary | ICD-10-CM

## 2019-06-03 DIAGNOSIS — K2990 Gastroduodenitis, unspecified, without bleeding: Secondary | ICD-10-CM

## 2019-06-03 DIAGNOSIS — D125 Benign neoplasm of sigmoid colon: Secondary | ICD-10-CM

## 2019-06-03 DIAGNOSIS — K621 Rectal polyp: Secondary | ICD-10-CM | POA: Diagnosis not present

## 2019-06-03 DIAGNOSIS — D122 Benign neoplasm of ascending colon: Secondary | ICD-10-CM

## 2019-06-03 DIAGNOSIS — K5730 Diverticulosis of large intestine without perforation or abscess without bleeding: Secondary | ICD-10-CM

## 2019-06-03 DIAGNOSIS — K635 Polyp of colon: Secondary | ICD-10-CM | POA: Diagnosis not present

## 2019-06-03 DIAGNOSIS — K2970 Gastritis, unspecified, without bleeding: Secondary | ICD-10-CM

## 2019-06-03 DIAGNOSIS — R1310 Dysphagia, unspecified: Secondary | ICD-10-CM | POA: Diagnosis not present

## 2019-06-03 MED ORDER — SODIUM CHLORIDE 0.9 % IV SOLN: 500.0000 mL | Freq: Once | INTRAVENOUS | Status: DC

## 2019-06-03 NOTE — Op Note (Signed)
[facility] Patient Name: Frank Clark Procedure Date: 06/03/2019 11:02 AM MRN: [id number] Endoscopist: Gerrit Heck , MD Age: 69 Referring MD:  Date of Birth: [birth year] Gender: Male Account #: [id number] Procedure:                Upper GI endoscopy Indications:              Dysphagia, Suspected esophageal reflux Medicines:                Monitored Anesthesia Care Procedure:                Pre-Anesthesia Assessment:                           - Prior to the procedure, a History and Physical                            was performed, and patient medications and                            allergies were reviewed. The patient's tolerance of                            previous anesthesia was also reviewed. The risks                            and benefits of the procedure and the sedation                            options and risks were discussed with the patient.                            All questions were answered, and informed consent                            was obtained. Prior Anticoagulants: The patient has                            taken no previous anticoagulant or antiplatelet                            agents. ASA Grade Assessment: III - A patient with                            severe systemic disease. After reviewing the risks                            and benefits, the patient was deemed in                            satisfactory condition to undergo the procedure.                           After obtaining informed consent, the endoscope was  passed under direct vision. Throughout the                            procedure, the patient's blood pressure, pulse, and                            oxygen saturations were monitored continuously. The                            Endoscope was introduced through the mouth, and                            advanced to the second part of duodenum. The upper                            GI endoscopy  was accomplished without difficulty.                            The patient tolerated the procedure well. Scope In: Scope Out: Findings:                 The examined esophagus was normal. The scope was                            withdrawn. Dilation was performed with a Maloney                            dilator with no resistance at 5 Fr. The dilation                            site was examined following endoscope reinsertion                            and showed no bleeding, mucosal tear or perforation.                           The Z-line was regular and was found 40 cm from the                            incisors.                           Scattered mild inflammation characterized by                            erythema was found in the gastric fundus, in the                            gastric body, at the incisura and in the gastric                            antrum. Biopsies were taken with a cold forceps for  Helicobacter pylori testing. Estimated blood loss                            was minimal.                           The duodenal bulb, first portion of the duodenum                            and second portion of the duodenum were normal. Complications:            No immediate complications. Estimated Blood Loss:     Estimated blood loss was minimal. Impression:               - Normal esophagus. Dilated.                           - Z-line regular, 40 cm from the incisors.                           - Gastritis. Biopsied.                           - Normal duodenal bulb, first portion of the                            duodenum and second portion of the duodenum. Recommendation:           - Patient has a contact number available for                            emergencies. The signs and symptoms of potential                            delayed complications were discussed with the                            patient. Return to normal activities tomorrow.                             Written discharge instructions were provided to the                            patient.                           - Soft diet today and advance to previous diet                            tomorrow.                           - Continue present medications.                           - Await pathology results.                           -  Perform a colonoscopy today.                           - Ok to resume Eliquis tomorrow. Gerrit Heck, MD 06/03/2019 11:49:12 AM

## 2019-06-03 NOTE — Patient Instructions (Addendum)
Soft diet today and then advance to previous diet tomorrow.-See handout on Soft diet.   Resume Eliquis tomorrow 06-04-19  Handouts given on Soft diet, Gastritis, hemorrhoids, diverticulosis, high fiber diet and polyps.   YOU HAD AN ENDOSCOPIC PROCEDURE TODAY AT [facility]:   Refer to the procedure report that was given to you for any specific questions about what was found during the examination.  If the procedure report does not answer your questions, please call your gastroenterologist to clarify.  If you requested that your care partner not be given the details of your procedure findings, then the procedure report has been included in a sealed envelope for you to review at your convenience later.  YOU SHOULD EXPECT: Some feelings of bloating in the abdomen. Passage of more gas than usual.  Walking can help get rid of the air that was put into your GI tract during the procedure and reduce the bloating. If you had a lower endoscopy (such as a colonoscopy or flexible sigmoidoscopy) you may notice spotting of blood in your stool or on the toilet paper. If you underwent a bowel prep for your procedure, you may not have a normal bowel movement for a few days.  Please Note:  You might notice some irritation and congestion in your nose or some drainage.  This is from the oxygen used during your procedure.  There is no need for concern and it should clear up in a day or so.  SYMPTOMS TO REPORT IMMEDIATELY:   Following lower endoscopy (colonoscopy or flexible sigmoidoscopy):  Excessive amounts of blood in the stool  Significant tenderness or worsening of abdominal pains  Swelling of the abdomen that is new, acute  Fever of 100F or higher   Following upper endoscopy (EGD)  Vomiting of blood or coffee ground material  New chest pain or pain under the shoulder blades  Painful or persistently difficult swallowing  New shortness of breath  Fever of 100F or higher  Black,  tarry-looking stools  For urgent or emergent issues, a gastroenterologist can be reached at any hour by calling [phone].   DIET:  SEE POST DILATION DIET, SOFT FOODS FOR THE REST OF TODAY.  We do recommend a small meal at first, but then you may proceed to your regular diet.  Drink plenty of fluids but you should avoid alcoholic beverages for 24 hours.  ACTIVITY:  You should plan to take it easy for the rest of today and you should NOT DRIVE or use heavy machinery until tomorrow (because of the sedation medicines used during the test).    FOLLOW UP: Our staff will call the number listed on your records 48-72 hours following your procedure to check on you and address any questions or concerns that you may have regarding the information given to you following your procedure. If we do not reach you, we will leave a message.  We will attempt to reach you two times.  During this call, we will ask if you have developed any symptoms of COVID 19. If you develop any symptoms (ie: fever, flu-like symptoms, shortness of breath, cough etc.) before then, please call [phone].  If you test positive for Covid 19 in the 2 weeks post procedure, please call and report this information to [place].    If any biopsies were taken you will be contacted by phone or by letter within the next 1-3 weeks.  Please call us at [phone] if you have not heard about  the biopsies in 3 weeks.    SIGNATURES/CONFIDENTIALITY: You and/or your care partner have signed paperwork which will be entered into your electronic medical record.  These signatures attest to the fact that that the information above on your After Visit Summary has been reviewed and is understood.  Full responsibility of the confidentiality of this discharge information lies with you and/or your care-partner.

## 2019-06-03 NOTE — Progress Notes (Signed)
Called to room to assist during endoscopic procedure.  Patient ID and intended procedure confirmed with present staff. Received instructions for my participation in the procedure from the performing physician.  

## 2019-06-03 NOTE — Op Note (Signed)
[facility] Patient Name: Frank Clark Procedure Date: 06/03/2019 11:01 AM MRN: [id number] Endoscopist: Gerrit Heck , MD Age: 69 Referring MD:  Date of Birth: 1951/01/18 Gender: Male Account #: [id number] Procedure:                Colonoscopy Indications:              Screening for colorectal malignant neoplasm (last                            colonoscopy was 10 years ago)                           History of diverticulosis with recurrent                            diverticulitis, with last episode in 2019, treated                            with antibiotics. No prior surgical intervention                            for diverticular disease. Medicines:                Monitored Anesthesia Care Procedure:                Pre-Anesthesia Assessment:                           - Prior to the procedure, a History and Physical                            was performed, and patient medications and                            allergies were reviewed. The patient's tolerance of                            previous anesthesia was also reviewed. The risks                            and benefits of the procedure and the sedation                            options and risks were discussed with the patient.                            All questions were answered, and informed consent                            was obtained. Prior Anticoagulants: The patient has                            taken no previous anticoagulant or antiplatelet  agents. ASA Grade Assessment: III - A patient with                            severe systemic disease. After reviewing the risks                            and benefits, the patient was deemed in                            satisfactory condition to undergo the procedure.                           After obtaining informed consent, the colonoscope                            was passed under direct vision. Throughout the                 procedure, the patient's blood pressure, pulse, and                            oxygen saturations were monitored continuously. The                            Colonoscope was introduced through the anus and                            advanced to the the cecum, identified by                            appendiceal orifice and ileocecal valve. The                            colonoscopy was performed without difficulty. The                            patient tolerated the procedure well. The quality                            of the bowel preparation was adequate. The                            ileocecal valve, appendiceal orifice, and rectum                            were photographed. Scope In: 11:18:54 AM Scope Out: 11:42:52 AM Scope Withdrawal Time: 0 hours 22 minutes 5 seconds  Total Procedure Duration: 0 hours 23 minutes 58 seconds  Findings:                 The perianal and digital rectal examinations were                            normal.  Three sessile polyps were found in the ascending                            colon (2) and cecum. The polyps were 2 to 3 mm in                            size. These polyps were removed with a cold biopsy                            forceps. Resection and retrieval were complete.                            Estimated blood loss was minimal.                           A 2 mm polyp was found in the sigmoid colon. The                            polyp was sessile. The polyp was removed with a                            cold biopsy forceps. Resection and retrieval were                            complete. Estimated blood loss was minimal.                           A 3 mm polyp was found in the sigmoid colon. The                            polyp was sessile and located within a diverticula.                            Biopsies were taken with a cold forceps for                            histology. Area opposite the  polyp was tattooed                            with an injection of 0.5 mL of Spot (carbon black).                           Two sessile polyps were found in the rectum. The                            polyps were diminutive in size. These polyps were                            removed with a cold biopsy forceps. Resection and                            retrieval  were complete. Estimated blood loss was                            minimal.                           Non-bleeding internal hemorrhoids were found during                            anoscopy. The hemorrhoids were small.                           Multiple small and large-mouthed diverticula were                            found in the sigmoid colon, descending colon,                            transverse colon, ascending colon and cecum.                           Retroflexion in the rectum was not performed due to                            anatomy (narrowed rectal vault). Complications:            No immediate complications. Estimated Blood Loss:     Estimated blood loss was minimal. Impression:               - Three 2 to 3 mm polyps in the ascending colon and                            in the cecum, removed with a cold biopsy forceps.                            Resected and retrieved.                           - One 2 mm polyp in the sigmoid colon, removed with                            a cold biopsy forceps. Resected and retrieved.                           - One 3 mm polyp in the sigmoid colon, located                            within a diverticula. Biopsied. Tattooed.                           - Two diminutive polyps in the rectum, removed with                            a cold biopsy forceps. Resected and retrieved.                           -  Non-bleeding internal hemorrhoids.                           - Diverticulosis in the sigmoid colon, in the                            descending colon, in the transverse colon, in the                             ascending colon and in the cecum. Recommendation:           - Patient has a contact number available for                            emergencies. The signs and symptoms of potential                            delayed complications were discussed with the                            patient. Return to normal activities tomorrow.                            Written discharge instructions were provided to the                            patient.                           - Continue present medications.                           - Await pathology results.                           - Repeat colonoscopy for surveillance based on                            pathology results.                           - Return to GI clinic PRN.                           - Resume Eliquis (apixaban) at prior dose tomorrow. Gerrit Heck, MD 06/03/2019 11:57:52 AM

## 2019-06-05 ENCOUNTER — Telehealth: Payer: Self-pay

## 2019-06-05 NOTE — Telephone Encounter (Signed)
  Follow up Call-  Call back number 06/03/2019  Post procedure Call Back phone  # [phone]  Permission to leave phone message Yes  Some recent data might be hidden     Patient questions:  Do you have a fever, pain , or abdominal swelling? No. Pain Score  0 *  Have you tolerated food without any problems? Yes.    Have you been able to return to your normal activities? Yes.    Do you have any questions about your discharge instructions: Diet   No. Medications  No. Follow up visit  No.  Do you have questions or concerns about your Care? No.  Actions: * If pain score is 4 or above: 1. No action needed, pain <4.Have you developed a fever since your procedure? no  2.   Have you had an respiratory symptoms (SOB or cough) since your procedure? no  3.   Have you tested positive for COVID 19 since your procedure no  4.   Have you had any family members/close contacts diagnosed with the COVID 19 since your procedure?  no   If yes to any of these questions please route to Joylene John, RN and Alphonsa Gin, Therapist, sports.

## 2019-06-10 ENCOUNTER — Encounter: Payer: Self-pay | Admitting: Gastroenterology

## 2019-10-01 DIAGNOSIS — J208 Acute bronchitis due to other specified organisms: Secondary | ICD-10-CM | POA: Diagnosis not present

## 2019-10-01 DIAGNOSIS — B9689 Other specified bacterial agents as the cause of diseases classified elsewhere: Secondary | ICD-10-CM | POA: Diagnosis not present

## 2020-02-25 DIAGNOSIS — Z23 Encounter for immunization: Secondary | ICD-10-CM | POA: Diagnosis not present

## 2020-05-12 DIAGNOSIS — K219 Gastro-esophageal reflux disease without esophagitis: Secondary | ICD-10-CM | POA: Diagnosis not present

## 2020-05-12 DIAGNOSIS — I480 Paroxysmal atrial fibrillation: Secondary | ICD-10-CM | POA: Diagnosis not present

## 2020-05-12 DIAGNOSIS — M5137 Other intervertebral disc degeneration, lumbosacral region: Secondary | ICD-10-CM | POA: Diagnosis not present

## 2020-05-12 DIAGNOSIS — D682 Hereditary deficiency of other clotting factors: Secondary | ICD-10-CM | POA: Diagnosis not present

## 2020-05-12 DIAGNOSIS — Z79899 Other long term (current) drug therapy: Secondary | ICD-10-CM | POA: Diagnosis not present

## 2020-05-12 DIAGNOSIS — E785 Hyperlipidemia, unspecified: Secondary | ICD-10-CM | POA: Diagnosis not present

## 2020-11-09 DIAGNOSIS — Z1331 Encounter for screening for depression: Secondary | ICD-10-CM | POA: Diagnosis not present

## 2020-11-09 DIAGNOSIS — I480 Paroxysmal atrial fibrillation: Secondary | ICD-10-CM | POA: Diagnosis not present

## 2020-11-09 DIAGNOSIS — Z139 Encounter for screening, unspecified: Secondary | ICD-10-CM | POA: Diagnosis not present

## 2020-11-09 DIAGNOSIS — E785 Hyperlipidemia, unspecified: Secondary | ICD-10-CM | POA: Diagnosis not present

## 2020-11-09 DIAGNOSIS — Z125 Encounter for screening for malignant neoplasm of prostate: Secondary | ICD-10-CM | POA: Diagnosis not present

## 2020-11-09 DIAGNOSIS — Z9181 History of falling: Secondary | ICD-10-CM | POA: Diagnosis not present

## 2020-11-09 DIAGNOSIS — K219 Gastro-esophageal reflux disease without esophagitis: Secondary | ICD-10-CM | POA: Diagnosis not present

## 2020-11-09 DIAGNOSIS — Z79899 Other long term (current) drug therapy: Secondary | ICD-10-CM | POA: Diagnosis not present

## 2020-11-09 DIAGNOSIS — M5137 Other intervertebral disc degeneration, lumbosacral region: Secondary | ICD-10-CM | POA: Diagnosis not present

## 2021-02-10 DIAGNOSIS — Z20822 Contact with and (suspected) exposure to covid-19: Secondary | ICD-10-CM | POA: Diagnosis not present

## 2021-02-10 DIAGNOSIS — U071 COVID-19: Secondary | ICD-10-CM | POA: Diagnosis not present

## 2021-02-16 DIAGNOSIS — Z1331 Encounter for screening for depression: Secondary | ICD-10-CM | POA: Diagnosis not present

## 2021-02-16 DIAGNOSIS — E785 Hyperlipidemia, unspecified: Secondary | ICD-10-CM | POA: Diagnosis not present

## 2021-02-16 DIAGNOSIS — Z9181 History of falling: Secondary | ICD-10-CM | POA: Diagnosis not present

## 2021-02-16 DIAGNOSIS — Z0000 Encounter for general adult medical examination without abnormal findings: Secondary | ICD-10-CM | POA: Diagnosis not present

## 2021-04-07 DIAGNOSIS — M25562 Pain in left knee: Secondary | ICD-10-CM | POA: Diagnosis not present

## 2021-04-27 DIAGNOSIS — Z23 Encounter for immunization: Secondary | ICD-10-CM | POA: Diagnosis not present

## 2021-05-12 DIAGNOSIS — R252 Cramp and spasm: Secondary | ICD-10-CM | POA: Diagnosis not present

## 2021-05-12 DIAGNOSIS — E785 Hyperlipidemia, unspecified: Secondary | ICD-10-CM | POA: Diagnosis not present

## 2021-05-12 DIAGNOSIS — Z79899 Other long term (current) drug therapy: Secondary | ICD-10-CM | POA: Diagnosis not present

## 2021-05-12 DIAGNOSIS — I480 Paroxysmal atrial fibrillation: Secondary | ICD-10-CM | POA: Diagnosis not present

## 2021-05-12 DIAGNOSIS — K219 Gastro-esophageal reflux disease without esophagitis: Secondary | ICD-10-CM | POA: Diagnosis not present

## 2021-05-19 DIAGNOSIS — R070 Pain in throat: Secondary | ICD-10-CM | POA: Diagnosis not present

## 2021-05-19 DIAGNOSIS — Z20822 Contact with and (suspected) exposure to covid-19: Secondary | ICD-10-CM | POA: Diagnosis not present

## 2021-05-19 DIAGNOSIS — J0100 Acute maxillary sinusitis, unspecified: Secondary | ICD-10-CM | POA: Diagnosis not present

## 2021-05-19 DIAGNOSIS — H9203 Otalgia, bilateral: Secondary | ICD-10-CM | POA: Diagnosis not present

## 2021-05-19 DIAGNOSIS — R0982 Postnasal drip: Secondary | ICD-10-CM | POA: Diagnosis not present

## 2021-05-22 DIAGNOSIS — J208 Acute bronchitis due to other specified organisms: Secondary | ICD-10-CM | POA: Diagnosis not present

## 2021-05-22 DIAGNOSIS — B9689 Other specified bacterial agents as the cause of diseases classified elsewhere: Secondary | ICD-10-CM | POA: Diagnosis not present

## 2021-05-29 ENCOUNTER — Telehealth: Payer: Self-pay | Admitting: Internal Medicine

## 2021-05-29 NOTE — Telephone Encounter (Signed)
I spoke with wife who had called our office. She states Rev.Elsen has had palpitations for a week since he developed bronchitis and was placed on steroids. They stopped the prednisone 5 days ago because of this.Wife also states he is not taking phenylephrine-acetaminophen any longer.He is using plain mucinex.Denies using any products with caffeine.  His biggest complaint is fatigue. His BP at home normally runs 118/70's per wife. Her reading yesterday was 107/77. His heart rate has twice been accelerated last moments.120-140  Apt made next week with Dr.Klein. Wife states they would like to be referred to A-fib clinic as her mother had received care from them.  Mrs.Quinto will continue to monitor Rev.Striders BP/HR and will go to the ED if HR remains elevated or BP is lower.     I will [place] End[place]AD[place]PHQ,NE

## 2021-05-29 NOTE — Telephone Encounter (Signed)
Patient c/o Palpitations:  High priority if patient c/o lightheadedness, shortness of breath, or chest pain  How long have you had palpitations/irregular HR/ Afib? Are you having the symptoms now? About a week. Patient still having symptoms  Are you currently experiencing lightheadedness, SOB or CP? SOB, but patient is also recovering from bronchitis  Do you have a history of afib (atrial fibrillation) or irregular heart rhythm? yes  Have you checked your BP or HR? (document readings if available):   Are you experiencing any other symptoms? Patient is recovering from bronchitis.  Patient was taking decadron then started taking prednisone. He stopped taking prednisone after 3 days because he thinks that's what took him out of normal sinus rhythm.  Patient has not been seen by Dr. Caryl Comes for 3 years. He is scheduled to see Dr. Caryl Comes 06/06/21. Wife was calling to report symptoms

## 2021-05-31 ENCOUNTER — Encounter (HOSPITAL_COMMUNITY): Payer: Self-pay

## 2021-05-31 ENCOUNTER — Other Ambulatory Visit: Payer: Self-pay

## 2021-05-31 ENCOUNTER — Emergency Department (HOSPITAL_COMMUNITY)
Admission: EM | Admit: 2021-05-31 | Discharge: 2021-05-31 | Disposition: A | Discharge status: Home / Self Care | Payer: Medicare Other | Attending: Emergency Medicine | Admitting: Emergency Medicine

## 2021-05-31 DIAGNOSIS — Z5321 Procedure and treatment not carried out due to patient leaving prior to being seen by health care provider: Secondary | ICD-10-CM | POA: Insufficient documentation

## 2021-05-31 DIAGNOSIS — M79604 Pain in right leg: Secondary | ICD-10-CM | POA: Insufficient documentation

## 2021-05-31 DIAGNOSIS — M545 Low back pain, unspecified: Secondary | ICD-10-CM | POA: Diagnosis not present

## 2021-05-31 DIAGNOSIS — M79605 Pain in left leg: Secondary | ICD-10-CM | POA: Insufficient documentation

## 2021-05-31 DIAGNOSIS — J069 Acute upper respiratory infection, unspecified: Secondary | ICD-10-CM | POA: Insufficient documentation

## 2021-05-31 DIAGNOSIS — I4891 Unspecified atrial fibrillation: Secondary | ICD-10-CM | POA: Insufficient documentation

## 2021-05-31 LAB — BASIC METABOLIC PANEL
Anion gap: 11 (ref 5–15)
BUN: 20 mg/dL (ref 8–23)
CO2: 24 mmol/L (ref 22–32)
Calcium: 9 mg/dL (ref 8.9–10.3)
Chloride: 106 mmol/L (ref 98–111)
Creatinine, Ser: 1.18 mg/dL (ref 0.61–1.24)
GFR, Estimated: >60 mL/min (ref >60)
Glucose, Bld: 158 mg/dL — ABNORMAL HIGH (ref 70–99)
Potassium: 4.3 mmol/L (ref 3.5–5.1)
Sodium: 141 mmol/L (ref 135–145)

## 2021-05-31 LAB — CBC
HCT: 47.7 % (ref 39.0–52.0)
Hemoglobin: 16.1 g/dL (ref 13.0–17.0)
MCH: 31.2 pg (ref 26.0–34.0)
MCHC: 33.8 g/dL (ref 30.0–36.0)
MCV: 92.4 fL (ref 80.0–100.0)
Platelets: 175 10*3/uL (ref 150–400)
RBC: 5.16 MIL/uL (ref 4.22–5.81)
RDW: 13.1 % (ref 11.5–15.5)
WBC: 11.2 10*3/uL — ABNORMAL HIGH (ref 4.0–10.5)
nRBC: 0 % (ref 0.0–0.2)

## 2021-05-31 LAB — PROTIME-INR
INR: 1 (ref 0.8–1.2)
Prothrombin Time: 12.8 seconds (ref 11.4–15.2)

## 2021-05-31 LAB — APTT: aPTT: 30 seconds (ref 24–36)

## 2021-05-31 MED ORDER — ACETAMINOPHEN 325 MG PO TABS: 650.0000 mg | ORAL_TABLET | Freq: Once | ORAL | Status: DC

## 2021-05-31 NOTE — ED Triage Notes (Signed)
Pt states that he has been in and out of afib for the past week and having bilateral leg pain, lower back pain, hx of DVT. Pt recently had URI and had been on antibiotics and prednisone, pt having SOB at this time, denies CP

## 2021-05-31 NOTE — ED Provider Triage Note (Signed)
Emergency Medicine Provider Triage Evaluation Note  Baldomero Mirarchi , a 71 y.o. male  was evaluated in triage.  Pt complains of back pain, leg pain, headache while working on acute bronchitis over the last few days. Is on eliquis 2/2 afib, hx of clots. Hx of factor v leiden. Reports new headache, frontal, does not frequently get headaches. Denies chest pain. Fever has improved since recent infection.  Review of Systems  Positive: Sob, back pain, leg pain, headache Negative: Chest pain, loc  Physical Exam  BP (!) 173/91 (BP Location: Right Arm)    Pulse (!) 101    Temp 97.7 F (36.5 C) (Oral)    Resp 18    Ht 6' (1.829 m)    Wt 98 kg    SpO2 96%    BMI 29.29 kg/m  Gen:   Awake, minimal distress   Resp:  Normal effort  MSK:   Moves extremities without difficulty  Other:  CN2-12 grossly intact  Medical Decision Making  Medically screening exam initiated at 7:30 PM.  Appropriate orders placed.  Becker Christopher was informed that the remainder of the evaluation will be completed by another provider, this initial triage assessment does not replace that evaluation, and the importance of remaining in the ED until their evaluation is complete.  Workup initiated   Anselmo Pickler, [place] 05/31/21 1938

## 2021-05-31 NOTE — ED Notes (Addendum)
Patient walked out door.

## 2021-06-03 DIAGNOSIS — I4891 Unspecified atrial fibrillation: Secondary | ICD-10-CM | POA: Diagnosis not present

## 2021-06-05 NOTE — Progress Notes (Deleted)
ELECTROPHYSIOLOGY CONSULT NOTE  Patient ID: Frank Clark, MRN: [id number], DOB/AGE: May 28, 1950 71 y.o. Admit date: (Not on file) Date of Consult: 06/05/2021  Primary Physician: Frank Dress, MD Primary Cardiologist: ***     Frank Clark is a 71 y.o. male who is being seen today for the evaluation of *** at the request of ***.    HPI Frank Clark is a 71 y.o. male ***  Last seen 1/20 with dx of persistent Afib -- prior DCCV   DATE TEST EF     2011 Myoview   No Ischemia    12/12 Echo   60-65 %     2/20 Echo  55-60%      Factor V Leiden deficiency. history of a clot in his leg X 2.   . Thromboembolic risk factors ( age -36) for a CHADSVASc Score of 1   Past Medical History:  Diagnosis Date   Atrial fibrillation ([place])    assoc with diverticulitis and RMSF   Complication of anesthesia 02/24/2011   Hard to wake up after Gallbladder   Diverticulitis    DVT, lower extremity (HCC)    left   Factor V deficiency (HCC)    PVC (premature ventricular contraction)    RMSF [facility] spotted fever)    Shortness of breath       Surgical History:  Past Surgical History:  Procedure Laterality Date   CHOLECYSTECTOMY     INGUINAL HERNIA REPAIR     left   LUMBAR LAMINECTOMY  2005   TEE WITHOUT CARDIOVERSION  04/23/2011   Procedure: TRANSESOPHAGEAL ECHOCARDIOGRAM (TEE);  Surgeon: Frank Champagne, MD;  Location: [facility] ENDOSCOPY;  Service: Cardiovascular;  Laterality: N/A;   TONSILLECTOMY       Home Meds: No outpatient medications have been marked as taking for the 06/06/21 encounter (Appointment) with Frank Sprang, MD.    Allergies:  Allergies  Allergen Reactions   Sulfonamide Derivatives Itching and Rash    REACTION: Reaction not know    Social History   Socioeconomic History   Marital status: Married    Spouse name: Not on file   Number of children: Not on file   Years of education: Not on file   Highest education level: Not on file   Occupational History   Occupation: pastor  Tobacco Use   Smoking status: Former    Types: Cigarettes    Quit date: 1980    Years since quitting: 43.1   Smokeless tobacco: Never  Vaping Use   Vaping Use: Never used  Substance and Sexual Activity   Alcohol use: No   Drug use: No   Sexual activity: Not on file  Other Topics Concern   Not on file  Social History Narrative   Not on file   Social Determinants of Health   Financial Resource Strain: Not on file  Food Insecurity: Not on file  Transportation Needs: Not on file  Physical Activity: Not on file  Stress: Not on file  Social Connections: Not on file  Intimate Partner Violence: Not on file     Family History  Problem Relation Age of Onset   Stroke Father    Cancer Mother    Coronary artery disease Brother    Colon cancer Neg Hx      ROS:  Please see the history of present illness.   {ros master:[id number]}  All other systems reviewed and negative.    Physical Exam:*** There were no vitals taken for  this visit. General: Well developed, well nourished male in no acute distress. Head: Normocephalic, atraumatic, sclera non-icteric, no xanthomas, nares are without discharge. EENT: normal  Lymph Nodes:  none Neck: Negative for carotid bruits. JVD not elevated. Back:without scoliosis kyphosis*** Lungs: Clear bilaterally to auscultation without wheezes, rales, or rhonchi. Breathing is unlabored. Heart: RRR with S1 S2. No *** ***/6 systolic*** murmur . No rubs, or gallops appreciated. Abdomen: Soft, non-tender, non-distended with normoactive bowel sounds. No hepatomegaly. No rebound/guarding. No obvious abdominal masses. Msk:  Strength and tone appear normal for age. Extremities: No clubbing or cyanosis. No*** ***+*** edema.  Distal pedal pulses are 2+ and equal bilaterally. Skin: Warm and Dry Neuro: Alert and oriented X 3. CN III-XII intact Grossly normal sensory and motor function . Psych:  Responds to questions  appropriately with a normal affect.        EKG: ***   Assessment and Plan: *** Virl Axe

## 2021-06-06 ENCOUNTER — Encounter: Payer: Self-pay | Admitting: Internal Medicine

## 2021-06-06 ENCOUNTER — Other Ambulatory Visit: Payer: Self-pay

## 2021-06-06 ENCOUNTER — Ambulatory Visit (INDEPENDENT_AMBULATORY_CARE_PROVIDER_SITE_OTHER): Payer: Medicare Other | Admitting: Internal Medicine

## 2021-06-06 VITALS — BP 126/72 | HR 67 | Ht 72.0 in | Wt 219.4 lb

## 2021-06-06 DIAGNOSIS — I480 Paroxysmal atrial fibrillation: Secondary | ICD-10-CM

## 2021-06-06 NOTE — Progress Notes (Signed)
ELECTROPHYSIOLOGY New Patient NOTE  Patient ID: Frank Clark, MRN: [id number], DOB/AGE: 70-14-1952 70 y.o. Admit date: (Not on file) Date of Consult: 06/06/2021  Primary Physician: Nicoletta Dress, MD Primary Cardiologist: SK      HPI   Frank Clark is a 71 y.o. male who is being seen today for the evaluation of atrial fibrillation that lasted for a week.  He has a history of atrial fibrillation dating back years, typically triggered by combinations of bronchitis and steroids.  So  Developed a cough and bronchitis about 4-5 weeks ago, started on antibiotics, then put on steroids and shortly thereafter his heart went into Afib, so he stopped it.   In his atrial fibrillation, no palpitations however, diaphoretic, fatigued-severely.  Wife detected heart rate range Up to 150 but mostly 90-104 bpm. . His blood pressure has been as low as 92/77,  but the last time his systolic pressure was 213.  Documented on his son's [employer]; moreover his wife is a Marine scientist  The patient denies chest pain, nocturnal dyspnea, orthopnea or peripheral edema.  There have been no lightheadedness or syncope.   He reports that he has had two blood clots in his right LE to which he attributes his edema.  Known factor V Leiden deficiency.  He does not drink smoke or use recreational drugs and so that it    DATE TEST EF   2/12 Echo transesophageal  60-65 % Mild LVH   2/20 Echo   55-60 %          Date Cr K Hgb  1/20  4.4   1/23 1.18 4.3 08.6   Thromboembolic risk factors ( age -86 , HTN-1) for a CHADSVASc Score of >=2 On apixaban without bleeding   Past Medical History:  Diagnosis Date   Atrial fibrillation ([place])    assoc with diverticulitis and RMSF   Complication of anesthesia 02/24/2011   Hard to wake up after Gallbladder   Diverticulitis    DVT, lower extremity (HCC)    left   Factor V deficiency ([place])    PVC (premature ventricular contraction)    RMSF [facility] spotted  fever)    Shortness of breath       Surgical History:  Past Surgical History:  Procedure Laterality Date   CHOLECYSTECTOMY     INGUINAL HERNIA REPAIR     left   LUMBAR LAMINECTOMY  2005   TEE WITHOUT CARDIOVERSION  04/23/2011   Procedure: TRANSESOPHAGEAL ECHOCARDIOGRAM (TEE);  Surgeon: Loralie Champagne, MD;  Location: [facility] ENDOSCOPY;  Service: Cardiovascular;  Laterality: N/A;   TONSILLECTOMY       Home Meds: Current Meds  Medication Sig   apixaban (ELIQUIS) 5 MG TABS tablet Take 1 tablet (5 mg total) by mouth 2 (two) times daily.   nebivolol (BYSTOLIC) 5 MG tablet Take 1 tablet (5 mg total) by mouth daily. (Patient taking differently: Take 5 mg by mouth at bedtime.)   pantoprazole (PROTONIX) 40 MG tablet Take 40 mg by mouth at bedtime.   Phenylephrine-Acetaminophen (TYLENOL SINUS+HEADACHE PO) Take 2 tablets by mouth 2 (two) times daily as needed (sinus headaches).   traMADol (ULTRAM) 50 MG tablet Take 50 mg by mouth every 6 (six) hours as needed for moderate pain or severe pain.     Allergies:  Allergies  Allergen Reactions   Sulfonamide Derivatives Itching and Rash    REACTION: Reaction not know    Social History   Socioeconomic History   Marital  status: Married    Spouse name: Not on file   Number of children: Not on file   Years of education: Not on file   Highest education level: Not on file  Occupational History   Occupation: pastor  Tobacco Use   Smoking status: Former    Types: Cigarettes    Quit date: 1980    Years since quitting: 43.1   Smokeless tobacco: Never  Vaping Use   Vaping Use: Never used  Substance and Sexual Activity   Alcohol use: No   Drug use: No   Sexual activity: Not on file  Other Topics Concern   Not on file  Social History Narrative   Not on file   Social Determinants of Health   Financial Resource Strain: Not on file  Food Insecurity: Not on file  Transportation Needs: Not on file  Physical Activity: Not on file  Stress: Not  on file  Social Connections: Not on file  Intimate Partner Violence: Not on file     Family History  Problem Relation Age of Onset   Stroke Father    Cancer Mother    Coronary artery disease Brother    Colon cancer Neg Hx     ROS:  Please see the history of present illness. (+) Tiredness (+) Shortness of Breath (+) LE Edema All other systems reviewed and negative.    Physical Exam:  Blood pressure 126/72, pulse 67, height 6' (1.829 m), weight 219 lb 6.4 oz (99.5 kg), SpO2 96 %. General: Well developed, well nourished male in no acute distress. Head: Normocephalic, atraumatic, sclera non-icteric, no xanthomas, nares are without discharge. EENT: normal  Lymph Nodes:  none Neck: Negative for carotid bruits. JVD not elevated. Back:without scoliosis kyphosis Lungs: Clear bilaterally to auscultation without wheezes, rales, or rhonchi. Breathing is unlabored. Heart: RRR with S1 S2. no murmur . No rubs, or gallops appreciated. Abdomen: Soft, non-tender, non-distended with normoactive bowel sounds. No hepatomegaly. No rebound/guarding. No obvious abdominal masses. Msk:  Strength and tone appear normal for age. Extremities: No clubbing or cyanosis  edema.  Distal pedal pulses are 2+ and equal bilaterally. Skin: Warm and Dry Neuro: Alert and oriented X 3. CN III-XII intact Grossly normal sensory and motor function . Psych:  Responds to questions appropriately with a normal affect.        EKG: Sinus at 67 Oh 16/09/39 PVC-isolated   Assessment and Plan:  Atrial fibrillation-persistent  Hypertension  PVCs  Patient had recurrent atrial fibrillation after number of years.  Occurring in the context of bronchitis and concomitant steroids.  Given the infrequency, I do not know that it is necessary at this juncture to move towards more rapid rhythm control i.e. referral for catheter ablation.  However, if you were to have another episode within a year, I think that would be my next  recommendation, certainly prior to using antiarrhythmic drugs.  Continue with nebivolol.  In the event that he has recurrent atrial fibrillation with his documented rapid rates, we will have him increase his nebivolol by taking extra 2.5 mg every 6 as necessary  No bleeding, continue his Eliquis at 5 mg twice daily  Blood pressure well controlled on individual.    F/U in 12 months   I,Tinashe Williams,acting as a scribe for Virl Axe, MD.,have documented all relevant documentation on the behalf of Virl Axe, MD,as directed by  Virl Axe, MD while in the presence of Virl Axe, MD.   I, Virl Axe, MD, have reviewed  all documentation for this visit. The documentation on 06/06/21 for the exam, diagnosis, procedures, and orders are all accurate and complete.

## 2021-06-06 NOTE — Patient Instructions (Signed)
Medication Instructions:  Your physician recommends that you continue on your current medications as directed. Please refer to the Current Medication list given to you today.  *If you need a refill on your cardiac medications before your next appointment, please call your pharmacy*   Lab Work: None ordered.  If you have labs (blood work) drawn today and your tests are completely normal, you will receive your results only by: [place] (if you have MyChart) OR A paper copy in the mail If you have any lab test that is abnormal or we need to change your treatment, we will call you to review the results.   Testing/Procedures: None ordered.    Follow-Up: At [facility], you and your health needs are our priority.  As part of our continuing mission to provide you with exceptional heart care, we have created designated Provider Care Teams.  These Care Teams include your primary Cardiologist (physician) and Advanced Practice Providers (APPs -  Physician Assistants and Nurse Practitioners) who all work together to provide you with the care you need, when you need it.  We recommend signing up for the patient portal called "MyChart".  Sign up information is provided on this After Visit Summary.  MyChart is used to connect with patients for Virtual Visits (Telemedicine).  Patients are able to view lab/test results, encounter notes, upcoming appointments, etc.  Non-urgent messages can be sent to your provider as well.   To learn more about what you can do with MyChart, go to [email or web address].    Your next appointment:   Follow up with Dr Caryl Comes in 12 months1}    Other Instructions Afib clinic in 6 months

## 2021-06-16 DIAGNOSIS — J208 Acute bronchitis due to other specified organisms: Secondary | ICD-10-CM | POA: Diagnosis not present

## 2021-06-16 DIAGNOSIS — B9689 Other specified bacterial agents as the cause of diseases classified elsewhere: Secondary | ICD-10-CM | POA: Diagnosis not present

## 2021-10-31 DIAGNOSIS — K6289 Other specified diseases of anus and rectum: Secondary | ICD-10-CM | POA: Diagnosis not present

## 2021-10-31 DIAGNOSIS — K5730 Diverticulosis of large intestine without perforation or abscess without bleeding: Secondary | ICD-10-CM | POA: Diagnosis not present

## 2021-10-31 DIAGNOSIS — R1084 Generalized abdominal pain: Secondary | ICD-10-CM | POA: Diagnosis not present

## 2021-10-31 DIAGNOSIS — K6389 Other specified diseases of intestine: Secondary | ICD-10-CM | POA: Diagnosis not present

## 2021-10-31 DIAGNOSIS — A080 Rotaviral enteritis: Secondary | ICD-10-CM | POA: Diagnosis not present

## 2021-10-31 DIAGNOSIS — R197 Diarrhea, unspecified: Secondary | ICD-10-CM | POA: Diagnosis not present

## 2021-10-31 DIAGNOSIS — Z20822 Contact with and (suspected) exposure to covid-19: Secondary | ICD-10-CM | POA: Diagnosis not present

## 2021-10-31 DIAGNOSIS — R079 Chest pain, unspecified: Secondary | ICD-10-CM | POA: Diagnosis not present

## 2021-10-31 DIAGNOSIS — K760 Fatty (change of) liver, not elsewhere classified: Secondary | ICD-10-CM | POA: Diagnosis not present

## 2021-10-31 DIAGNOSIS — R109 Unspecified abdominal pain: Secondary | ICD-10-CM | POA: Diagnosis not present

## 2021-12-05 ENCOUNTER — Ambulatory Visit (HOSPITAL_COMMUNITY): Payer: Medicare Other | Admitting: Nurse Practitioner

## 2021-12-12 ENCOUNTER — Encounter (HOSPITAL_COMMUNITY): Payer: Self-pay | Admitting: Physician Assistant

## 2021-12-12 ENCOUNTER — Ambulatory Visit (HOSPITAL_COMMUNITY)
Admission: RE | Admit: 2021-12-12 | Discharge: 2021-12-12 | Disposition: A | Discharge status: Home / Self Care | Payer: Medicare Other | Source: Ambulatory Visit | Attending: Nurse Practitioner | Admitting: Nurse Practitioner

## 2021-12-12 VITALS — BP 116/74 | HR 70 | Ht 72.0 in | Wt 221.6 lb

## 2021-12-12 DIAGNOSIS — Z7901 Long term (current) use of anticoagulants: Secondary | ICD-10-CM | POA: Diagnosis not present

## 2021-12-12 DIAGNOSIS — I480 Paroxysmal atrial fibrillation: Secondary | ICD-10-CM

## 2021-12-12 DIAGNOSIS — Z79899 Other long term (current) drug therapy: Secondary | ICD-10-CM | POA: Diagnosis not present

## 2021-12-12 DIAGNOSIS — Z6830 Body mass index (BMI) 30.0-30.9, adult: Secondary | ICD-10-CM | POA: Insufficient documentation

## 2021-12-12 DIAGNOSIS — E669 Obesity, unspecified: Secondary | ICD-10-CM | POA: Insufficient documentation

## 2021-12-12 DIAGNOSIS — D6869 Other thrombophilia: Secondary | ICD-10-CM | POA: Diagnosis not present

## 2021-12-12 DIAGNOSIS — I10 Essential (primary) hypertension: Secondary | ICD-10-CM | POA: Insufficient documentation

## 2021-12-12 DIAGNOSIS — D682 Hereditary deficiency of other clotting factors: Secondary | ICD-10-CM | POA: Insufficient documentation

## 2021-12-12 NOTE — Progress Notes (Signed)
Primary Care Physician: Nicoletta Dress, MD Primary Cardiologist: none Primary Electrophysiologist: Dr Caryl Comes Referring Physician: Dr Corinna Capra is a 71 y.o. male with a history of Factor V deficiency, HTN, atrial fibrillation who presents for follow up in the [facility]. Patient is on Eliquis for a CHADS2VASC score of 2. He did have one episode of afib several weeks ago after working in the yard. His afib resolved after cooling off inside. Patient reports that heat/dehydration has been a trigger for him in the past. No bleeding issues on anticoagulation.   Today, he denies symptoms of palpitations, chest pain, shortness of breath, orthopnea, PND, lower extremity edema, dizziness, presyncope, syncope, snoring, daytime somnolence, bleeding, or neurologic sequela. The patient is tolerating medications without difficulties and is otherwise without complaint today.    Atrial Fibrillation Risk Factors:  he does not have symptoms or diagnosis of sleep apnea. he does not have a history of rheumatic fever.   he has a BMI of Body mass index is 30.05 kg/m.Marland Kitchen Filed Weights   12/12/21 1004  Weight: 100.5 kg    Family History  Problem Relation Age of Onset   Stroke Father    Cancer Mother    Coronary artery disease Brother    Colon cancer Neg Hx      Atrial Fibrillation Management history:  Previous antiarrhythmic drugs: none Previous cardioversions: none Previous ablations: none CHADS2VASC score: 2 Anticoagulation history: Eliquis   Past Medical History:  Diagnosis Date   Atrial fibrillation ([place])    assoc with diverticulitis and RMSF   Complication of anesthesia 02/24/2011   Hard to wake up after Gallbladder   Diverticulitis    DVT, lower extremity ([place])    left   Factor V deficiency ([place])    PVC (premature ventricular contraction)    RMSF [facility] spotted fever)    Shortness of breath    Past Surgical History:   Procedure Laterality Date   CHOLECYSTECTOMY     INGUINAL HERNIA REPAIR     left   LUMBAR LAMINECTOMY  2005   TEE WITHOUT CARDIOVERSION  04/23/2011   Procedure: TRANSESOPHAGEAL ECHOCARDIOGRAM (TEE);  Surgeon: Loralie Champagne, MD;  Location: [facility];  Service: Cardiovascular;  Laterality: N/A;   TONSILLECTOMY      Current Outpatient Medications  Medication Sig Dispense Refill   apixaban (ELIQUIS) 5 MG TABS tablet Take 1 tablet (5 mg total) by mouth 2 (two) times daily. 180 tablet 3   nebivolol (BYSTOLIC) 5 MG tablet Take 1 tablet (5 mg total) by mouth daily. 90 tablet 1   pantoprazole (PROTONIX) 40 MG tablet Take 40 mg by mouth at bedtime.     Phenylephrine-Acetaminophen (TYLENOL SINUS+HEADACHE PO) Take 2 tablets by mouth 2 (two) times daily as needed (sinus headaches).     traMADol (ULTRAM) 50 MG tablet Take 50 mg by mouth every 6 (six) hours as needed for moderate pain or severe pain.      No current facility-administered medications for this encounter.    Allergies  Allergen Reactions   Sulfonamide Derivatives Itching and Rash    REACTION: Reaction not know    Social History   Socioeconomic History   Marital status: Married    Spouse name: Not on file   Number of children: Not on file   Years of education: Not on file   Highest education level: Not on file  Occupational History   Occupation: pastor  Tobacco Use   Smoking status:  Former    Types: Cigarettes    Quit date: 1980    Years since quitting: 43.6   Smokeless tobacco: Never   Tobacco comments:    Former smoker 12/12/21  Vaping Use   Vaping Use: Never used  Substance and Sexual Activity   Alcohol use: No   Drug use: No   Sexual activity: Not on file  Other Topics Concern   Not on file  Social History Narrative   Not on file   Social Determinants of Health   Financial Resource Strain: Not on file  Food Insecurity: Not on file  Transportation Needs: Not on file  Physical Activity: Not on file   Stress: Not on file  Social Connections: Not on file  Intimate Partner Violence: Not on file     ROS- All systems are reviewed and negative except as per the HPI above.  Physical Exam: Vitals:   12/12/21 1004  BP: 116/74  Pulse: 70  Weight: 100.5 kg  Height: 6' (1.829 m)    GEN- The patient is a well appearing male, alert and oriented x 3 today.   Head- normocephalic, atraumatic Eyes-  Sclera clear, conjunctiva pink Ears- hearing intact Oropharynx- clear Neck- supple  Lungs- Clear to ausculation bilaterally, normal work of breathing Heart- Regular rate and rhythm, no murmurs, rubs or gallops  GI- soft, NT, ND, + BS Extremities- no clubbing, cyanosis, or edema MS- no significant deformity or atrophy Skin- no rash or lesion Psych- euthymic mood, full affect Neuro- strength and sensation are intact  Wt Readings from Last 3 Encounters:  12/12/21 100.5 kg  06/06/21 99.5 kg  05/31/21 98 kg    EKG today demonstrates  SR Vent. rate 70 BPM PR interval 158 ms QRS duration 80 ms QT/QTcB 378/408 ms  Echo 07/01/18 demonstrated   1. The left ventricle has normal systolic function, with an ejection  fraction of 55-60%. The cavity size was normal. Left ventricular diastolic parameters were normal.   2. The right ventricle has normal systolic function. The cavity was  normal. There is no increase in right ventricular wall thickness.   3. The mitral valve is normal in structure.   4. The tricuspid valve is normal in structure.   5. The aortic valve is normal in structure.   6. The pulmonic valve was normal in structure.   Epic records are reviewed at length today  CHA2DS2-VASc Score = 2  The patient's score is based upon: CHF History: 0 HTN History: 1 Diabetes History: 0 Stroke History: 0 Vascular Disease History: 0 Age Score: 1 Gender Score: 0       ASSESSMENT AND PLAN: 1. Paroxysmal Atrial Fibrillation (ICD10:  I48.0) The patient's CHA2DS2-VASc score is 2,  indicating a 2.2% annual risk of stroke.   Continue Eliquis 5 mg BID Continue nebivolol 5 mg daily If his afib becomes more frequent/persistent, could consider ablation vs AAD. We also discussed smart device technology for home monitoring.   2. Secondary Hypercoagulable State (ICD10:  D68.69) The patient is at significant risk for stroke/thromboembolism based upon his CHA2DS2-VASc Score of 2.  Continue Apixaban (Eliquis).   3. Obesity Body mass index is 30.05 kg/m. Lifestyle modification was discussed at length including regular exercise and weight reduction.  4. HTN Stable, no changes today.   Follow up in the AF clinic in one year.    [facility] [address] [place], [place] [address] [phone] 12/12/2021 10:10 AM

## 2022-02-28 DIAGNOSIS — Z23 Encounter for immunization: Secondary | ICD-10-CM | POA: Diagnosis not present

## 2022-03-22 DIAGNOSIS — Z1331 Encounter for screening for depression: Secondary | ICD-10-CM | POA: Diagnosis not present

## 2022-03-22 DIAGNOSIS — Z125 Encounter for screening for malignant neoplasm of prostate: Secondary | ICD-10-CM | POA: Diagnosis not present

## 2022-03-22 DIAGNOSIS — E785 Hyperlipidemia, unspecified: Secondary | ICD-10-CM | POA: Diagnosis not present

## 2022-03-22 DIAGNOSIS — U099 Post covid-19 condition, unspecified: Secondary | ICD-10-CM | POA: Diagnosis not present

## 2022-03-22 DIAGNOSIS — R0602 Shortness of breath: Secondary | ICD-10-CM | POA: Diagnosis not present

## 2022-03-22 DIAGNOSIS — M5137 Other intervertebral disc degeneration, lumbosacral region: Secondary | ICD-10-CM | POA: Diagnosis not present

## 2022-03-22 DIAGNOSIS — I480 Paroxysmal atrial fibrillation: Secondary | ICD-10-CM | POA: Diagnosis not present

## 2022-03-22 DIAGNOSIS — A080 Rotaviral enteritis: Secondary | ICD-10-CM | POA: Diagnosis not present

## 2022-03-22 DIAGNOSIS — K219 Gastro-esophageal reflux disease without esophagitis: Secondary | ICD-10-CM | POA: Diagnosis not present

## 2022-03-22 DIAGNOSIS — Z9181 History of falling: Secondary | ICD-10-CM | POA: Diagnosis not present

## 2022-03-22 DIAGNOSIS — R7301 Impaired fasting glucose: Secondary | ICD-10-CM | POA: Diagnosis not present

## 2022-03-22 DIAGNOSIS — R0609 Other forms of dyspnea: Secondary | ICD-10-CM | POA: Diagnosis not present

## 2022-06-14 ENCOUNTER — Encounter (HOSPITAL_COMMUNITY): Payer: Self-pay | Admitting: *Deleted

## 2022-07-19 ENCOUNTER — Encounter: Payer: Self-pay | Admitting: Gastroenterology

## 2022-08-17 DIAGNOSIS — M5126 Other intervertebral disc displacement, lumbar region: Secondary | ICD-10-CM | POA: Diagnosis not present

## 2022-08-17 DIAGNOSIS — M5137 Other intervertebral disc degeneration, lumbosacral region: Secondary | ICD-10-CM | POA: Diagnosis not present

## 2022-08-17 DIAGNOSIS — M5416 Radiculopathy, lumbar region: Secondary | ICD-10-CM | POA: Diagnosis not present

## 2022-08-26 NOTE — Progress Notes (Unsigned)
Cardiology Office Note Date:  08/29/2022  Patient ID:  Frank Clark, DOB 07/21/1950, MRN [id number] PCP:  Paulina Fusi, MD  Electrophysiologist: Dr. Graciela Husbands    Chief Complaint:  annual visit  History of Present Illness: Frank Clark is a 72 y.o. male with history of AFib, Factor V Leiden, DVT (2009), HTN, PVCs  He saw Dr. Graciela Husbands 06/06/21, with infrequent AFib did not think more aggressive rhythm control (ablation) strategies were needed, though if more, would increase his BB and refer/consider ablation.  He saw the Afib clinic 12/12/21, had an episode of AFib after/provoked by working in the yard/heat several weeks prior, this apparently a known trigger  TODAY Generally feels well No CP, no palpitations or cardiac awareness  He at times has been aware of his AFib gives him a little heaviness in his head/neck, but there have been times he has been found in Afib and unaware of any particular symptoms, so he doesn't think that he would really know how much AFib or PVCs he may/may not be having.  He denies any difficulties with his ADLs, casual activities. 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 He notices with hospital visits climbing the stairs there, or walking out to the deer stand this past season that he gets a little more SOB, but seems to recover quickly.  No near syncope or syncope. No bleeding or signs of bleeding PMD does labs twice a year.  Chronic back issues after a few accidents, seeing his back specialist soon, has been on steroid for this, his mother in law recently passed away and this last few weeks or so have been difficult as well from this, he wonders of this may have provoked his Afib, that he ws very surprised to hear he was in today   Device information Diagnosed  2010 associated with bout of diverticulitis   Past Medical History:  Diagnosis Date   Atrial fibrillation    assoc with diverticulitis and RMSF   Complication of anesthesia 02/24/2011    Hard to wake up after Gallbladder   Diverticulitis    DVT, lower extremity    left   Factor V deficiency    PVC (premature ventricular contraction)    RMSF [facility] spotted fever)    Shortness of breath     Past Surgical History:  Procedure Laterality Date   CHOLECYSTECTOMY     INGUINAL HERNIA REPAIR     left   LUMBAR LAMINECTOMY  2005   TEE WITHOUT CARDIOVERSION  04/23/2011   Procedure: TRANSESOPHAGEAL ECHOCARDIOGRAM (TEE);  Surgeon: Marca Ancona, MD;  Location: [facility] ENDOSCOPY;  Service: Cardiovascular;  Laterality: N/A;   TONSILLECTOMY      Current Outpatient Medications  Medication Sig Dispense Refill   apixaban (ELIQUIS) 5 MG TABS tablet Take 1 tablet (5 mg total) by mouth 2 (two) times daily. 180 tablet 3   nebivolol (BYSTOLIC) 5 MG tablet Take 1 tablet (5 mg total) by mouth daily. 90 tablet 1   pantoprazole (PROTONIX) 40 MG tablet Take 40 mg by mouth at bedtime.     Phenylephrine-Acetaminophen (TYLENOL SINUS+HEADACHE PO) Take 2 tablets by mouth 2 (two) times daily as needed (sinus headaches).     predniSONE (DELTASONE) 10 MG tablet Take 10 mg by mouth daily with breakfast. Per patient taking last pill today     traMADol (ULTRAM) 50 MG tablet Take 50 mg by mouth every 6 (six) hours as needed for moderate pain or severe pain.  No current facility-administered medications for this visit.    Allergies:   Sulfonamide derivatives   Social History:  The patient  reports that he quit smoking about 44 years ago. His smoking use included cigarettes. He has never used smokeless tobacco. He reports that he does not drink alcohol and does not use drugs.   Family History:  The patient's family history includes Cancer in his mother; Coronary artery disease in his brother; Stroke in his father.  ROS:  Please see the history of present illness.    All other systems are reviewed and otherwise negative.   PHYSICAL EXAM:  VS:  BP 108/80   Pulse 69   Ht 6' (1.829 m)   Wt  222 lb 6.4 oz (100.9 kg)   SpO2 96%   BMI 30.16 kg/m  BMI: Body mass index is 30.16 kg/m. Well nourished, well developed, in no acute distress HEENT: normocephalic, atraumatic Neck: no JVD, carotid bruits or masses Cardiac:  irreg-irreg; no significant murmurs, no rubs, or gallops Lungs:   CTA b/l, no wheezing, rhonchi or rales Abd: soft, nontender MS: no deformity or  atrophy Ext:  no edema Skin: warm and dry, no rash Neuro:  No gross deficits appreciated Psych: euthymic mood, full affect   EKG:  Done today and reviewed by myself shows  AFib 73bpm, LAD   07/01/2018: TTE 1. The left ventricle has normal systolic function, with an ejection  fraction of 55-60%. The cavity size was normal. Left ventricular diastolic  parameters were normal.   2. The right ventricle has normal systolic function. The cavity was  normal. There is no increase in right ventricular wall thickness.   3. The mitral valve is normal in structure.   4. The tricuspid valve is normal in structure.   5. The aortic valve is normal in structure.   6. The pulmonic valve was normal in structure.   Recent Labs: No results found for requested labs within last 365 days.  No results found for requested labs within last 365 days.   CrCl cannot be calculated (Patient's most recent lab result is older than the maximum 21 days allowed.).   Wt Readings from Last 3 Encounters:  08/29/22 222 lb 6.4 oz (100.9 kg)  12/12/21 221 lb 9.6 oz (100.5 kg)  06/06/21 219 lb 6.4 oz (99.5 kg)     Other studies reviewed: Additional studies/records reviewed today include: summarized above  ASSESSMENT AND PLAN:  Persistent Afib CHA2DS2Vasc is 4, on eliquis, appropriately dosed Hx of factor V Leiden and DVT unclear burden by symptoms/lack of symptoms  Very surprised to hear he is in AFib today, no symptoms, but recently with increased stressors  Discussed management strategies, medicines/ablation for rhythm control and rate  control Will start with an updated echo and a 1 week monitor to assess his DOE and rate/rhythm, burden, PVCs to help guide next step decisions   PVCs None today  HTN Looks great  4. Secondary hypercoagulable state  Disposition: F/u with Dr. Graciela Husbands in 6-8 weeks, will have a better idea of plans for his back/management of this and any needs for [facility] interruption, and rate/rhythm strategy decisions   Current medicines are reviewed at length with the patient today.  The patient did not have any concerns regarding medicines.  Norma Fredrickson, PA-C 08/29/2022 9:55 AM     [facility] [address] [place] [address] [phone] (office)  [phone] (fax)

## 2022-08-29 ENCOUNTER — Ambulatory Visit: Payer: Medicare Other | Attending: Physician Assistant

## 2022-08-29 ENCOUNTER — Encounter: Payer: Self-pay | Admitting: Physician Assistant

## 2022-08-29 ENCOUNTER — Ambulatory Visit: Payer: Medicare Other | Attending: Physician Assistant | Admitting: Physician Assistant

## 2022-08-29 VITALS — BP 108/80 | HR 69 | Ht 72.0 in | Wt 222.4 lb

## 2022-08-29 DIAGNOSIS — I480 Paroxysmal atrial fibrillation: Secondary | ICD-10-CM | POA: Diagnosis not present

## 2022-08-29 DIAGNOSIS — I10 Essential (primary) hypertension: Secondary | ICD-10-CM | POA: Diagnosis not present

## 2022-08-29 DIAGNOSIS — I493 Ventricular premature depolarization: Secondary | ICD-10-CM | POA: Insufficient documentation

## 2022-08-29 DIAGNOSIS — D6869 Other thrombophilia: Secondary | ICD-10-CM | POA: Insufficient documentation

## 2022-08-29 NOTE — Progress Notes (Unsigned)
Enrolled patient for a 7 day Zio XT monitor to be mailed to patients home   Dr Klein to read 

## 2022-08-29 NOTE — Patient Instructions (Addendum)
Medication Instructions:    Your physician recommends that you continue on your current medications as directed. Please refer to the Current Medication list given to you today.   *If you need a refill on your cardiac medications before your next appointment, please call your pharmacy*   Lab Work:  NONE ORDERED  TODAY   If you have labs (blood work) drawn today and your tests are completely normal, you will receive your results only by: MyChart Message (if you have MyChart) OR A paper copy in the mail If you have any lab test that is abnormal or we need to change your treatment, we will call you to review the results.   Testing/Procedures: Your physician has requested that you have an echocardiogram. Echocardiography is a painless test that uses sound waves to create images of your heart. It provides your doctor with information about the size and shape of your heart and how well your heart's chambers and valves are working. This procedure takes approximately one hour. There are no restrictions for this procedure. Please do NOT wear cologne, perfume, aftershave, or lotions (deodorant is allowed). Please arrive 15 minutes prior to your appointment time.   Your physician has recommended that you wear an event monitor. Event monitors are medical devices that record the heart's electrical activity. Doctors most often [place] these monitors to diagnose arrhythmias. Arrhythmias are problems with the speed or rhythm of the heartbeat. The monitor is a small, portable device. You can wear one while you do your normal daily activities. This is usually used to diagnose what is causing palpitations/syncope (passing out).   Follow-Up: At [facility], you and your health needs are our priority.  As part of our continuing mission to provide you with exceptional heart care, we have created designated Provider Care Teams.  These Care Teams include your primary Cardiologist (physician) and Advanced  Practice Providers (APPs -  Physician Assistants and Nurse Practitioners) who all work together to provide you with the care you need, when you need it.  We recommend signing up for the patient portal called "MyChart".  Sign up information is provided on this After Visit Summary.  MyChart is used to connect with patients for Virtual Visits (Telemedicine).  Patients are able to view lab/test results, encounter notes, upcoming appointments, etc.  Non-urgent messages can be sent to your provider as well.   To learn more about what you can do with MyChart, go to [email or web address].    Your next appointment:    6 -8 week(s)   Provider:    You may see Dr. Graciela Husbands:     Other Instructions  ZIO XT- Long Term Monitor Instructions  Your physician has requested you wear a ZIO patch monitor for 7  days.  This is a single patch monitor. [employer] supplies one patch monitor per enrollment. Additional stickers are not available. Please do not apply patch if you will be having a Nuclear Stress Test,  Echocardiogram, Cardiac CT, MRI, or Chest Xray during the period you would be wearing the  monitor. The patch cannot be worn during these tests. You cannot remove and re-apply the  ZIO XT patch monitor.  Your ZIO patch monitor will be mailed 3 day USPS to your address on file. It may take 3-5 days  to receive your monitor after you have been enrolled.  Once you have received your monitor, please review the enclosed instructions. Your monitor  has already been registered assigning a specific monitor serial # to you.  Billing and Patient Assistance Program Information  We have supplied [employer] with any of your insurance information on file for billing purposes. [employer] offers a sliding scale Patient Assistance Program for patients that do not have  insurance, or whose insurance does not completely cover the cost of the ZIO monitor.  You must apply for the Patient Assistance Program to qualify for this  discounted rate.  To apply, please call [employer] at [phone], select option 4, select option 2, ask to apply for  Patient Assistance Program. Theodore Demark will ask your household income, and how many people  are in your household. They will quote your out-of-pocket cost based on that information.  [employer] will also be able to set up a 21-month interest-free payment plan if needed.  Applying the monitor   Shave hair from upper left chest.  Hold abrader disc by orange tab. Rub abrader in 40 strokes over the upper left chest as  indicated in your monitor instructions.  Clean area with 4 enclosed alcohol pads. Let dry.  Apply patch as indicated in monitor instructions. Patch will be placed under collarbone on left  side of chest with arrow pointing upward.  Rub patch adhesive wings for 2 minutes. Remove white label marked "1". Remove the white  label marked "2". Rub patch adhesive wings for 2 additional minutes.  While looking in a mirror, press and release button in center of patch. A small green light will  flash 3-4 times. This will be your only indicator that the monitor has been turned on.  Do not shower for the first 24 hours. You may shower after the first 24 hours.  Press the button if you feel a symptom. You will hear a small click. Record Date, Time and  Symptom in the Patient Logbook.  When you are ready to remove the patch, follow instructions on the last 2 pages of Patient  Logbook. Stick patch monitor onto the last page of Patient Logbook.  Place Patient Logbook in the blue and white box. Use locking tab on box and tape box closed  securely. The blue and white box has prepaid postage on it. Please place it in the mailbox as  soon as possible. Your physician should have your test results approximately 7 days after the  monitor [facility]  Call [employer] [phone] you have questions regarding  your ZIO XT patch monitor. Call  them immediately if you see an orange light blinking on your  monitor.  If your monitor falls off in less than 4 days, contact our Monitor department at [phone]  If your monitor becomes loose or falls off after 4 days call [employer] at [phone]  suggestions on securing your monitor

## 2022-08-31 DIAGNOSIS — M542 Cervicalgia: Secondary | ICD-10-CM | POA: Diagnosis not present

## 2022-08-31 DIAGNOSIS — Z6830 Body mass index (BMI) 30.0-30.9, adult: Secondary | ICD-10-CM | POA: Diagnosis not present

## 2022-08-31 DIAGNOSIS — R29818 Other symptoms and signs involving the nervous system: Secondary | ICD-10-CM | POA: Diagnosis not present

## 2022-09-01 DIAGNOSIS — I480 Paroxysmal atrial fibrillation: Secondary | ICD-10-CM

## 2022-09-04 DIAGNOSIS — M545 Low back pain, unspecified: Secondary | ICD-10-CM | POA: Diagnosis not present

## 2022-09-04 DIAGNOSIS — M5416 Radiculopathy, lumbar region: Secondary | ICD-10-CM | POA: Diagnosis not present

## 2022-09-15 DIAGNOSIS — I480 Paroxysmal atrial fibrillation: Secondary | ICD-10-CM | POA: Diagnosis not present

## 2022-09-20 DIAGNOSIS — R7301 Impaired fasting glucose: Secondary | ICD-10-CM | POA: Diagnosis not present

## 2022-09-20 DIAGNOSIS — M5137 Other intervertebral disc degeneration, lumbosacral region: Secondary | ICD-10-CM | POA: Diagnosis not present

## 2022-09-20 DIAGNOSIS — E785 Hyperlipidemia, unspecified: Secondary | ICD-10-CM | POA: Diagnosis not present

## 2022-09-20 DIAGNOSIS — Z79899 Other long term (current) drug therapy: Secondary | ICD-10-CM | POA: Diagnosis not present

## 2022-09-20 DIAGNOSIS — I480 Paroxysmal atrial fibrillation: Secondary | ICD-10-CM | POA: Diagnosis not present

## 2022-09-20 DIAGNOSIS — K219 Gastro-esophageal reflux disease without esophagitis: Secondary | ICD-10-CM | POA: Diagnosis not present

## 2022-09-25 ENCOUNTER — Other Ambulatory Visit (HOSPITAL_COMMUNITY): Payer: Medicare Other

## 2022-10-10 ENCOUNTER — Ambulatory Visit (HOSPITAL_COMMUNITY): Payer: Medicare Other | Attending: Cardiology

## 2022-10-10 DIAGNOSIS — I480 Paroxysmal atrial fibrillation: Secondary | ICD-10-CM | POA: Diagnosis not present

## 2022-10-10 LAB — ECHOCARDIOGRAM COMPLETE
Area-P 1/2: 3.48 cm2
S' Lateral: 2.7 cm

## 2022-10-11 NOTE — Progress Notes (Signed)
Sent message via Mychart for results

## 2022-10-29 ENCOUNTER — Ambulatory Visit (HOSPITAL_COMMUNITY): Payer: Medicare Other | Admitting: Physician Assistant

## 2022-10-30 ENCOUNTER — Ambulatory Visit: Payer: Medicare Other | Attending: Internal Medicine | Admitting: Internal Medicine

## 2022-10-30 ENCOUNTER — Encounter: Payer: Self-pay | Admitting: Internal Medicine

## 2022-10-30 ENCOUNTER — Other Ambulatory Visit: Payer: Self-pay

## 2022-10-30 VITALS — BP 98/62 | HR 65 | Ht 72.0 in | Wt 225.0 lb

## 2022-10-30 DIAGNOSIS — I493 Ventricular premature depolarization: Secondary | ICD-10-CM | POA: Diagnosis not present

## 2022-10-30 DIAGNOSIS — Z79899 Other long term (current) drug therapy: Secondary | ICD-10-CM | POA: Diagnosis not present

## 2022-10-30 DIAGNOSIS — I4819 Other persistent atrial fibrillation: Secondary | ICD-10-CM | POA: Diagnosis not present

## 2022-10-30 MED ORDER — FLECAINIDE ACETATE 50 MG PO TABS: 25.0000 mg | ORAL_TABLET | Freq: Two times a day (BID) | ORAL | 3 refills | Status: DC

## 2022-10-30 MED ORDER — FLECAINIDE ACETATE 50 MG PO TABS: 75.0000 mg | ORAL_TABLET | Freq: Two times a day (BID) | ORAL | 3 refills | Status: DC

## 2022-10-30 NOTE — Progress Notes (Signed)
Patient Care Team: Paulina Fusi, MD as PCP - General (Internal Medicine) Paulina Fusi, MD (Internal Medicine)   HPI  Frank Clark is a 72 y.o. male seen in follow-up for atrial fibrillation and factor V Leiden deficiency and hypertension.   Interval atrial fibrillation when he was seen RU-PA and found to be asymptomatic 4/24.  A monitor was obtained that demonstrated multiple episodes of atrial tachycardia following the termination of the atrial fibrillation event.  Today, he notes that he had been in atrial fibrillation for about a week up until yesterday, pulse oximeter had demonstrated heart rates in the 100s.  Fatigued, washed out, lassitude some dyspnea.  \Tolerating Eliquis apart from the burden of cost,, no bleeding.        DATE TEST EF    2/12 Echo transesophageal  60-65 % Mild LVH   2/20 Echo   55-60 %     6/24 Echo  55-60%      Date Cr K Hgb  1/20   4.4    1/23 1.18 4.3 16.1  6/24       Thromboembolic risk factors ( age -49 , HTN-1) for a CHADSVASc Score of >=2 Records and Results Reviewed   Past Medical History:  Diagnosis Date   Atrial fibrillation (HCC)    assoc with diverticulitis and RMSF   Complication of anesthesia 02/24/2011   Hard to wake up after Gallbladder   Diverticulitis    DVT, lower extremity (HCC)    left   Factor V deficiency (HCC)    PVC (premature ventricular contraction)    RMSF [facility] spotted fever)    Shortness of breath     Past Surgical History:  Procedure Laterality Date   CHOLECYSTECTOMY     INGUINAL HERNIA REPAIR     left   LUMBAR LAMINECTOMY  2005   TEE WITHOUT CARDIOVERSION  04/23/2011   Procedure: TRANSESOPHAGEAL ECHOCARDIOGRAM (TEE);  Surgeon: Marca Ancona, MD;  Location: [facility] ENDOSCOPY;  Service: Cardiovascular;  Laterality: N/A;   TONSILLECTOMY      Current Meds  Medication Sig   apixaban (ELIQUIS) 5 MG TABS tablet Take 1 tablet (5 mg total) by mouth 2 (two) times daily.    nebivolol (BYSTOLIC) 5 MG tablet Take 1 tablet (5 mg total) by mouth daily.   pantoprazole (PROTONIX) 40 MG tablet Take 40 mg by mouth at bedtime.   Phenylephrine-Acetaminophen (TYLENOL SINUS+HEADACHE PO) Take 2 tablets by mouth 2 (two) times daily as needed (sinus headaches).   traMADol (ULTRAM) 50 MG tablet Take 50 mg by mouth every 6 (six) hours as needed for moderate pain or severe pain.    [DISCONTINUED] predniSONE (DELTASONE) 10 MG tablet Take 10 mg by mouth daily with breakfast. Per patient taking last pill today    Allergies  Allergen Reactions   Sulfonamide Derivatives Itching and Rash    REACTION: Reaction not know      Review of Systems negative except from HPI and PMH  Physical Exam BP 98/62   Pulse 65   Ht 6' (1.829 m)   Wt 225 lb (102.1 kg)   SpO2 96%   BMI 30.52 kg/m  Well developed and well nourished in no acute distress HENT normal E scleral and icterus clear Neck Supple JVP flat; carotids brisk and full Clear to ausculation Regular rate and rhythm, no murmurs gallops or rub Soft with active bowel sounds No clubbing cyanosis  Edema Alert and oriented, grossly normal motor and  sensory function Skin Warm and Dry  ECG sinus rhythm at 65 116/09/40 Inferior Q waves PACs  CrCl cannot be calculated (Patient's most recent lab result is older than the maximum 21 days allowed.).   Assessment and  Plan Atrial fibrillation-persistent   Hypertension   PVCs  Patient with persistent atrial fibrillation now with increasing frequent recurrences.  Will refer for catheter ablation.  Given the time domains, we will also begin him on flecainide 75 twice daily.  Will need a treadmill test and a QRS assessment thereafter we will set that up for about 2 weeks.  No bleeding on the Eliquis.  Will check CBC as it has been 18 months.  Blood pressure is on the low side.  Will keep his Bystolic at 5, the problem is with paroxysms of atrial fibrillation associate with a  rapid rate.      Current medicines are reviewed at length with the patient today .  The patient does not  have concerns regarding medicines.

## 2022-10-30 NOTE — Patient Instructions (Addendum)
Medication Instructions:  Your physician has recommended you make the following change in your medication:   ** Begin Flecainide 75mg  - 1-1/2 tablets by mouth twice daily.  *If you need a refill on your cardiac medications before your next appointment, please call your pharmacy*   Lab Work: BMET and CBC  If you have labs (blood work) drawn today and your tests are completely normal, you will receive your results only by: MyChart Message (if you have MyChart) OR A paper copy in the mail If you have any lab test that is abnormal or we need to change your treatment, we will call you to review the results.   Testing/Procedures: Your physician has requested that you have an exercise tolerance test. For further information please visit [email or web address] Please also follow instruction sheet, as given.    Follow-Up: At [facility], you and your health needs are our priority.  As part of our continuing mission to provide you with exceptional heart care, we have created designated Provider Care Teams.  These Care Teams include your primary Cardiologist (physician) and Advanced Practice Providers (APPs -  Physician Assistants and Nurse Practitioners) who all work together to provide you with the care you need, when you need it.  We recommend signing up for the patient portal called "MyChart".  Sign up information is provided on this After Visit Summary.  MyChart is used to connect with patients for Virtual Visits (Telemedicine).  Patients are able to view lab/test results, encounter notes, upcoming appointments, etc.  Non-urgent messages can be sent to your provider as well.   To learn more about what you can do with MyChart, go to [email or web address].    Your next appointment:   12 months with Dr Graciela Husbands

## 2022-10-31 LAB — BASIC METABOLIC PANEL
BUN/Creatinine Ratio: 16 (ref 10–24)
BUN: 21 mg/dL (ref 8–27)
CO2: 26 mmol/L (ref 20–29)
Calcium: 9.4 mg/dL (ref 8.6–10.2)
Chloride: 102 mmol/L (ref 96–106)
Creatinine, Ser: 1.28 mg/dL — ABNORMAL HIGH (ref 0.76–1.27)
Glucose: 102 mg/dL — ABNORMAL HIGH (ref 70–99)
Potassium: 5.1 mmol/L (ref 3.5–5.2)
Sodium: 140 mmol/L (ref 134–144)
eGFR: 60 mL/min/{1.73_m2} (ref >59)

## 2022-10-31 LAB — CBC
Hematocrit: 46.4 % (ref 37.5–51.0)
Hemoglobin: 15.9 g/dL (ref 13.0–17.7)
MCH: 31.5 pg (ref 26.6–33.0)
MCHC: 34.3 g/dL (ref 31.5–35.7)
MCV: 92 fL (ref 79–97)
Platelets: 186 10*3/uL (ref 150–450)
RBC: 5.04 x10E6/uL (ref 4.14–5.80)
RDW: 13 % (ref 11.6–15.4)
WBC: 9.8 10*3/uL (ref 3.4–10.8)

## 2022-11-07 NOTE — Addendum Note (Signed)
Addended by: Duke Salvia on: 11/07/2022 09:13 AM   Modules accepted: Orders

## 2022-11-07 NOTE — Addendum Note (Signed)
Addended by: Alois Cliche on: 11/07/2022 09:07 AM   Modules accepted: Orders

## 2022-11-13 ENCOUNTER — Encounter: Payer: Self-pay | Admitting: Internal Medicine

## 2022-11-13 ENCOUNTER — Ambulatory Visit: Payer: Medicare Other | Attending: Cardiology

## 2022-11-13 DIAGNOSIS — Z79899 Other long term (current) drug therapy: Secondary | ICD-10-CM | POA: Diagnosis not present

## 2022-11-13 DIAGNOSIS — I493 Ventricular premature depolarization: Secondary | ICD-10-CM | POA: Insufficient documentation

## 2022-11-13 DIAGNOSIS — I4819 Other persistent atrial fibrillation: Secondary | ICD-10-CM | POA: Diagnosis not present

## 2022-11-13 LAB — EXERCISE TOLERANCE TEST
Angina Index: 0
Duke Treadmill Score: 9
Estimated workload: 10.1
Exercise duration (min): 9 min
Exercise duration (sec): 0 s
MPHR: 150 {beats}/min
Peak HR: 126 {beats}/min
Percent HR: 84 %
RPE: 15
Rest HR: 64 {beats}/min
ST Depression (mm): 0 mm

## 2022-11-21 MED ORDER — FLECAINIDE ACETATE 50 MG PO TABS: 50.0000 mg | ORAL_TABLET | Freq: Two times a day (BID) | ORAL | Status: DC

## 2022-12-14 ENCOUNTER — Ambulatory Visit: Payer: Medicare Other | Admitting: Cardiovascular Disease

## 2023-01-23 NOTE — Progress Notes (Unsigned)
Electrophysiology Office Note:    Date:  01/24/2023   ID:  Frank Clark, DOB [birth year], MRN [id number]  PCP:  Paulina Fusi, MD   [facility] Providers Cardiologist:  None Electrophysiologist:  Maurice Small, MD     Referring MD: Paulina Fusi, MD   History of Present Illness:    Frank Clark is a 72 y.o. male with a medical history significant for paroxysmal atrial fibrillation, factor V Leiden deficiency (2 DVTs), hypertension, referred for AF management.     He was diagnosed with atrial fibrillation years ago.  In the past episodes were triggered by bronchitis and steroids.  He has not had palpitations, but he does feel severely fatigued.    He has been noticing increasing frequency of atrial fibrillation.  He was referred for A-fib ablation and started on flecainide as a temporizing measure.   His wife is a Engineer, civil (consulting).     Reports that he is doing very well today. After starting flecainide, he has not had any symptomatic recurrence of atrial fibrillation.  EKGs/Labs/Other Studies Reviewed Today:    Echocardiogram:  TTE October 10, 2022 EF 55-60%; normal atrial sizes   Monitors:  Zio Monitor 10/2020 - my interpretation Predominantly sinus rhythm HR 47-167, avg 71 AF burden 5%: HR 47-146, avg 85  Episodes of SVT occurred consistent with atrial tachycardia PVC burden 6.4%  Stress testing:  ETT 11/13/2022 10 mets achieved. No evidence of ischemia  Advanced imaging:   Cardiac catherization   EKG:   EKG Interpretation Date/Time:  Thursday January 24 2023 08:26:23 EDT Ventricular Rate:  56 PR Interval:  170 QRS Duration:  92 QT Interval:  418 QTC Calculation: 403 R Axis:   -35  Text Interpretation: Sinus bradycardia with Premature atrial complexes Left axis deviation When compared with ECG of 30-Oct-2022 15:01, No significant change was found Confirmed by York Pellant [phone]) on 01/24/2023 4:58:14 PM     Physical Exam:    VS:   BP 128/80   Pulse (!) 56   Ht 6' (1.829 m)   Wt 227 lb (103 kg)   SpO2 97%   BMI 30.79 kg/m     Wt Readings from Last 3 Encounters:  01/24/23 227 lb (103 kg)  10/30/22 225 lb (102.1 kg)  08/29/22 222 lb 6.4 oz (100.9 kg)     GEN: Well nourished, well developed in no acute distress CARDIAC: RRR, no murmurs, rubs, gallops RESPIRATORY:  Normal work of breathing MUSCULOSKELETAL: no edema    ASSESSMENT & PLAN:    Atrial fibrillation ECG 4/24 shows 5% burden of AF with rates controlled on average He is symptomatic with fatigue and neck discomfort  We discussed the indication, rationale, logistics, anticipated benefits, and potential risks of the ablation procedure including but not limited to -- bleed at the groin access site, chest pain, damage to nearby organs such as the diaphragm, lungs, or esophagus, need for a drainage tube, or prolonged hospitalization. I explained that the risk for stroke, heart attack, need for open chest surgery, or even death is very low but not zero. he  expressed understanding and wishes to proceed.   Secondary hypercoagulable state Continue apixaban 5 mg twice daily  PVCs Burden low at 6.4%     Signed, Maurice Small, MD  01/24/2023 4:58 PM    [facility]

## 2023-01-24 ENCOUNTER — Ambulatory Visit: Payer: Medicare Other | Attending: Cardiovascular Disease | Admitting: Cardiovascular Disease

## 2023-01-24 ENCOUNTER — Ambulatory Visit: Payer: Medicare Other | Admitting: Cardiovascular Disease

## 2023-01-24 ENCOUNTER — Encounter: Payer: Self-pay | Admitting: Cardiovascular Disease

## 2023-01-24 VITALS — BP 128/80 | HR 56 | Ht 72.0 in | Wt 227.0 lb

## 2023-01-24 DIAGNOSIS — I4819 Other persistent atrial fibrillation: Secondary | ICD-10-CM | POA: Diagnosis not present

## 2023-01-24 NOTE — Patient Instructions (Signed)
Medication Instructions:  Your physician recommends that you continue on your current medications as directed. Please refer to the Current Medication list given to you today. *If you need a refill on your cardiac medications before your next appointment, please call your pharmacy*  Testing/Procedures: Atrial Fibrillation Ablation - scheduled for July 10, 2023 Your physician has recommended that you have an ablation. Catheter ablation is a medical procedure used to treat some cardiac arrhythmias (irregular heartbeats). During catheter ablation, a long, thin, flexible tube is put into a blood vessel in your groin (upper thigh), or neck. This tube is called an ablation catheter. It is then guided to your heart through the blood vessel. Radio frequency waves destroy small areas of heart tissue where abnormal heartbeats may cause an arrhythmia to start. Please see the instruction sheet given to you today.   Follow-Up: At [facility], you and your health needs are our priority.  As part of our continuing mission to provide you with exceptional heart care, we have created designated Provider Care Teams.  These Care Teams include your primary Cardiologist (physician) and Advanced Practice Providers (APPs -  Physician Assistants and Nurse Practitioners) who all work together to provide you with the care you need, when you need it.  We recommend signing up for the patient portal called "MyChart".  Sign up information is provided on this After Visit Summary.  MyChart is used to connect with patients for Virtual Visits (Telemedicine).  Patients are able to view lab/test results, encounter notes, upcoming appointments, etc.  Non-urgent messages can be sent to your provider as well.   To learn more about what you can do with MyChart, go to [email or web address].    Your next appointment:   Keep Feb 2025 appt  Provider:   York Pellant, MD

## 2023-02-14 DIAGNOSIS — L820 Inflamed seborrheic keratosis: Secondary | ICD-10-CM | POA: Diagnosis not present

## 2023-02-14 DIAGNOSIS — D225 Melanocytic nevi of trunk: Secondary | ICD-10-CM | POA: Diagnosis not present

## 2023-02-14 DIAGNOSIS — D485 Neoplasm of uncertain behavior of skin: Secondary | ICD-10-CM | POA: Diagnosis not present

## 2023-02-20 ENCOUNTER — Telehealth: Payer: Self-pay

## 2023-02-20 DIAGNOSIS — I4819 Other persistent atrial fibrillation: Secondary | ICD-10-CM

## 2023-02-20 NOTE — Telephone Encounter (Signed)
Spoke with patient, ablation moved up to 03/13/23. Labs to be completed on 02/25/23 at [facility] in [place], [place] scheduled for 02/27/23. No questions at this time

## 2023-02-25 ENCOUNTER — Other Ambulatory Visit: Payer: Medicare Other

## 2023-02-25 DIAGNOSIS — Z23 Encounter for immunization: Secondary | ICD-10-CM | POA: Diagnosis not present

## 2023-02-25 DIAGNOSIS — I4819 Other persistent atrial fibrillation: Secondary | ICD-10-CM | POA: Diagnosis not present

## 2023-02-25 LAB — CBC
Hematocrit: 49.6 % (ref 37.5–51.0)
Hemoglobin: 16.3 g/dL (ref 13.0–17.7)
MCH: 31.1 pg (ref 26.6–33.0)
MCHC: 32.9 g/dL (ref 31.5–35.7)
MCV: 95 fL (ref 79–97)
Platelets: 149 10*3/uL — ABNORMAL LOW (ref 150–450)
RBC: 5.24 x10E6/uL (ref 4.14–5.80)
RDW: 12.2 % (ref 11.6–15.4)
WBC: 7.1 10*3/uL (ref 3.4–10.8)

## 2023-02-26 LAB — BASIC METABOLIC PANEL
BUN/Creatinine Ratio: 16 (ref 10–24)
BUN: 17 mg/dL (ref 8–27)
CO2: 24 mmol/L (ref 20–29)
Calcium: 9.5 mg/dL (ref 8.6–10.2)
Chloride: 104 mmol/L (ref 96–106)
Creatinine, Ser: 1.06 mg/dL (ref 0.76–1.27)
Glucose: 102 mg/dL — ABNORMAL HIGH (ref 70–99)
Potassium: 4.7 mmol/L (ref 3.5–5.2)
Sodium: 142 mmol/L (ref 134–144)
eGFR: 75 mL/min/{1.73_m2} (ref >59)

## 2023-02-27 ENCOUNTER — Ambulatory Visit (HOSPITAL_COMMUNITY)
Admission: RE | Admit: 2023-02-27 | Discharge: 2023-02-27 | Disposition: A | Discharge status: Home / Self Care | Payer: Medicare Other | Source: Ambulatory Visit | Attending: Cardiovascular Disease | Admitting: Cardiovascular Disease

## 2023-02-27 DIAGNOSIS — I4819 Other persistent atrial fibrillation: Secondary | ICD-10-CM | POA: Insufficient documentation

## 2023-02-27 MED ORDER — IOHEXOL 350 MG/ML SOLN
95.0000 mL | Freq: Once | INTRAVENOUS | Status: AC | PRN
  Administered 2023-02-27: 95 mL via INTRAVENOUS

## 2023-03-07 DIAGNOSIS — C44329 Squamous cell carcinoma of skin of other parts of face: Secondary | ICD-10-CM | POA: Diagnosis not present

## 2023-03-12 ENCOUNTER — Telehealth: Payer: Self-pay | Admitting: Cardiovascular Disease

## 2023-03-12 NOTE — Pre-Procedure Instructions (Signed)
Instructed patient on the following items: Arrival time 0900 Nothing to eat or drink after midnight No meds AM of procedure Responsible person to drive you home and stay with you for 24 hrs  Have you missed any doses of anti-coagulant Eliquis- takes twice a day, hasn't missed any doses.  Don't take dose in the morning.

## 2023-03-12 NOTE — Telephone Encounter (Signed)
Patient's wife would like to clarify medication instructions for 11/06 procedure with Dr. Mealor--specifically, she would like to confirm whether patient needs to hold Eliquis. She would also like to know what time patient needs to arrive at [facility]. Please advise.

## 2023-03-12 NOTE — Telephone Encounter (Signed)
Spoke with the patient's wife and advised that the patient needs to continue his Eliquis, the only dose he will not take is tomorrow morning prior to his procedure. Advised that he will need to arrive at 9:30am. She verbalized understanding.

## 2023-03-13 ENCOUNTER — Ambulatory Visit (HOSPITAL_BASED_OUTPATIENT_CLINIC_OR_DEPARTMENT_OTHER): Payer: Self-pay | Admitting: Certified Registered"

## 2023-03-13 ENCOUNTER — Other Ambulatory Visit: Payer: Self-pay

## 2023-03-13 ENCOUNTER — Ambulatory Visit (HOSPITAL_COMMUNITY)
Admission: RE | Admit: 2023-03-13 | Discharge: 2023-03-13 | Disposition: A | Discharge status: Home / Self Care | Payer: Medicare Other | Attending: Cardiovascular Disease | Admitting: Cardiovascular Disease

## 2023-03-13 ENCOUNTER — Ambulatory Visit (HOSPITAL_COMMUNITY): Payer: Medicare Other | Admitting: Certified Registered"

## 2023-03-13 ENCOUNTER — Ambulatory Visit (HOSPITAL_COMMUNITY)
Admission: RE | Disposition: A | Discharge status: Home / Self Care | Payer: Self-pay | Source: Home / Self Care | Attending: Cardiovascular Disease

## 2023-03-13 DIAGNOSIS — I480 Paroxysmal atrial fibrillation: Secondary | ICD-10-CM | POA: Diagnosis not present

## 2023-03-13 DIAGNOSIS — I10 Essential (primary) hypertension: Secondary | ICD-10-CM | POA: Diagnosis not present

## 2023-03-13 DIAGNOSIS — Z87891 Personal history of nicotine dependence: Secondary | ICD-10-CM | POA: Diagnosis not present

## 2023-03-13 DIAGNOSIS — Z7901 Long term (current) use of anticoagulants: Secondary | ICD-10-CM | POA: Insufficient documentation

## 2023-03-13 DIAGNOSIS — I493 Ventricular premature depolarization: Secondary | ICD-10-CM | POA: Diagnosis not present

## 2023-03-13 DIAGNOSIS — K219 Gastro-esophageal reflux disease without esophagitis: Secondary | ICD-10-CM | POA: Insufficient documentation

## 2023-03-13 DIAGNOSIS — I4891 Unspecified atrial fibrillation: Secondary | ICD-10-CM | POA: Diagnosis not present

## 2023-03-13 DIAGNOSIS — D6851 Activated protein C resistance: Secondary | ICD-10-CM | POA: Diagnosis not present

## 2023-03-13 DIAGNOSIS — Z86718 Personal history of other venous thrombosis and embolism: Secondary | ICD-10-CM | POA: Insufficient documentation

## 2023-03-13 DIAGNOSIS — Z79899 Other long term (current) drug therapy: Secondary | ICD-10-CM | POA: Insufficient documentation

## 2023-03-13 HISTORY — PX: ATRIAL FIBRILLATION ABLATION: EP1191

## 2023-03-13 SURGERY — ATRIAL FIBRILLATION ABLATION
Anesthesia: General

## 2023-03-13 MED ORDER — EPHEDRINE SULFATE-NACL 50-0.9 MG/10ML-% IV SOSY
PREFILLED_SYRINGE | INTRAVENOUS | Status: DC | PRN
  Administered 2023-03-13: 5 mg via INTRAVENOUS

## 2023-03-13 MED ORDER — SODIUM CHLORIDE 0.9 % IV SOLN: INTRAVENOUS | Status: DC

## 2023-03-13 MED ORDER — OXYCODONE HCL 5 MG/5ML PO SOLN: 5.0000 mg | Freq: Once | ORAL | Status: DC | PRN

## 2023-03-13 MED ORDER — ATROPINE SULFATE 0.4 MG/ML IV SOLN
INTRAVENOUS | Status: DC | PRN
Start: 2023-03-13 — End: 2023-03-13
  Administered 2023-03-13: 1 mg via INTRAVENOUS

## 2023-03-13 MED ORDER — ACETAMINOPHEN 500 MG PO TABS
1000.0000 mg | ORAL_TABLET | Freq: Once | ORAL | Status: AC
  Administered 2023-03-13: 1000 mg via ORAL
  Filled 2023-03-13: qty 2

## 2023-03-13 MED ORDER — ONDANSETRON HCL 4 MG/2ML IJ SOLN: 4.0000 mg | Freq: Four times a day (QID) | INTRAMUSCULAR | Status: DC | PRN

## 2023-03-13 MED ORDER — LIDOCAINE 2% (20 MG/ML) 5 ML SYRINGE
INTRAMUSCULAR | Status: DC | PRN
  Administered 2023-03-13: 20 mg via INTRAVENOUS

## 2023-03-13 MED ORDER — MEPERIDINE HCL 25 MG/ML IJ SOLN: 6.2500 mg | INTRAMUSCULAR | Status: DC | PRN

## 2023-03-13 MED ORDER — OXYCODONE HCL 5 MG PO TABS: 5.0000 mg | ORAL_TABLET | Freq: Once | ORAL | Status: DC | PRN

## 2023-03-13 MED ORDER — ROCURONIUM BROMIDE 10 MG/ML (PF) SYRINGE
PREFILLED_SYRINGE | INTRAVENOUS | Status: DC | PRN
  Administered 2023-03-13: 10 mg via INTRAVENOUS
  Administered 2023-03-13: 40 mg via INTRAVENOUS
  Administered 2023-03-13: 60 mg via INTRAVENOUS

## 2023-03-13 MED ORDER — SODIUM CHLORIDE 0.9% FLUSH: 3.0000 mL | Freq: Two times a day (BID) | INTRAVENOUS | Status: DC

## 2023-03-13 MED ORDER — DEXAMETHASONE SODIUM PHOSPHATE 10 MG/ML IJ SOLN
INTRAMUSCULAR | Status: DC | PRN
  Administered 2023-03-13: 10 mg via INTRAVENOUS

## 2023-03-13 MED ORDER — FENTANYL CITRATE (PF) 250 MCG/5ML IJ SOLN
INTRAMUSCULAR | Status: DC | PRN
  Administered 2023-03-13: 100 ug via INTRAVENOUS

## 2023-03-13 MED ORDER — PROPOFOL 10 MG/ML IV BOLUS
INTRAVENOUS | Status: DC | PRN
  Administered 2023-03-13: 200 mg via INTRAVENOUS

## 2023-03-13 MED ORDER — SODIUM CHLORIDE 0.9 % IV SOLN
250.0000 mL | INTRAVENOUS | Status: DC | PRN
Start: 2023-03-13 — End: 2023-03-13

## 2023-03-13 MED ORDER — ONDANSETRON HCL 4 MG/2ML IJ SOLN
INTRAMUSCULAR | Status: DC | PRN
  Administered 2023-03-13: 4 mg via INTRAVENOUS

## 2023-03-13 MED ORDER — HEPARIN SODIUM (PORCINE) 1000 UNIT/ML IJ SOLN
INTRAMUSCULAR | Status: DC | PRN
Start: 2023-03-13 — End: 2023-03-13
  Administered 2023-03-13: 18000 [IU] via INTRAVENOUS

## 2023-03-13 MED ORDER — FENTANYL CITRATE (PF) 100 MCG/2ML IJ SOLN
INTRAMUSCULAR | Status: AC
  Filled 2023-03-13: qty 2

## 2023-03-13 MED ORDER — HEPARIN (PORCINE) IN NACL 1000-0.9 UT/500ML-% IV SOLN
INTRAVENOUS | Status: DC | PRN
  Administered 2023-03-13 (×3): 500 mL

## 2023-03-13 MED ORDER — MIDAZOLAM HCL 2 MG/2ML IJ SOLN: 0.5000 mg | Freq: Once | INTRAMUSCULAR | Status: DC | PRN

## 2023-03-13 MED ORDER — SUGAMMADEX SODIUM 200 MG/2ML IV SOLN
INTRAVENOUS | Status: DC | PRN
  Administered 2023-03-13: 300 mg via INTRAVENOUS

## 2023-03-13 MED ORDER — PHENYLEPHRINE HCL-NACL 20-0.9 MG/250ML-% IV SOLN
INTRAVENOUS | Status: DC | PRN
  Administered 2023-03-13: 50 ug/min via INTRAVENOUS

## 2023-03-13 MED ORDER — FENTANYL CITRATE (PF) 100 MCG/2ML IJ SOLN: 25.0000 ug | INTRAMUSCULAR | Status: DC | PRN

## 2023-03-13 MED ORDER — APIXABAN 5 MG PO TABS
5.0000 mg | ORAL_TABLET | Freq: Two times a day (BID) | ORAL | Status: DC
  Administered 2023-03-13: 5 mg via ORAL
  Filled 2023-03-13: qty 1

## 2023-03-13 MED ORDER — ACETAMINOPHEN 325 MG PO TABS
650.0000 mg | ORAL_TABLET | ORAL | Status: DC | PRN
  Administered 2023-03-13: 650 mg via ORAL
  Filled 2023-03-13: qty 2

## 2023-03-13 MED ORDER — PROTAMINE SULFATE 10 MG/ML IV SOLN
INTRAVENOUS | Status: DC | PRN
Start: 2023-03-13 — End: 2023-03-13
  Administered 2023-03-13: 30 mg via INTRAVENOUS

## 2023-03-13 MED ORDER — SODIUM CHLORIDE 0.9% FLUSH
3.0000 mL | INTRAVENOUS | Status: DC | PRN
Start: 2023-03-13 — End: 2023-03-13

## 2023-03-13 SURGICAL SUPPLY — 20 items
BAG SNAP BAND KOVER 36X36 (MISCELLANEOUS) ×1
CABLE PFA RX CATH CONN (CABLE) ×1
CATH 8FR REPROCESSED SOUNDSTAR (CATHETERS) ×1 IMPLANT
CATH FARAWAVE ABLATION 31 (CATHETERS) ×1
CATH OCTARAY 2.0 F 3-3-3-3-3 (CATHETERS) ×1
CATH WEBSTER BI DIR CS D-F CRV (CATHETERS) ×1
CLOSURE PERCLOSE PROSTYLE (VASCULAR PRODUCTS) ×2
COVER SWIFTLINK CONNECTOR (BAG) ×1
DEVICE CLOSURE MYNXGRIP 6/7F (Vascular Products) ×2 IMPLANT
DILATOR VESSEL 38 20CM 16FR (INTRODUCER) ×1
INQWIRE 1.5J .035X260CM (WIRE) ×1
PACK EP LATEX FREE (CUSTOM PROCEDURE TRAY) ×1
PACK EP LF (CUSTOM PROCEDURE TRAY) ×1
PAD DEFIB RADIO PHYSIO CONN (PAD) ×1
PATCH CARTO3 (PAD) ×1
SHEATH FARADRIVE STEERABLE (SHEATH) ×1
SHEATH PINNACLE 8F 10CM (SHEATH) ×1
SHEATH PINNACLE 9F 10CM (SHEATH) ×2
SHEATH PROBE COVER 6X72 (BAG) ×1
SHEATH WIRE KIT BAYLIS SL1 (KITS) ×1

## 2023-03-13 NOTE — Anesthesia Postprocedure Evaluation (Signed)
Anesthesia Post Note  Patient: Designer, industrial/product  Procedure(s) Performed: ATRIAL FIBRILLATION ABLATION     Patient location during evaluation: Cath Lab Anesthesia Type: General Level of consciousness: awake and alert, patient cooperative and oriented Pain management: pain level controlled Vital Signs Assessment: post-procedure vital signs reviewed and stable Respiratory status: spontaneous breathing, nonlabored ventilation and respiratory function stable Cardiovascular status: blood pressure returned to baseline and stable Postop Assessment: no apparent nausea or vomiting Anesthetic complications: no   No notable events documented.  Last Vitals:  Vitals:   03/13/23 1340 03/13/23 1346  BP: 129/81 130/82  Pulse: 88   Resp: 14   Temp:    SpO2: 95%     Last Pain:  Vitals:   03/13/23 1343  TempSrc:   PainSc: 0-No pain                 Abri Vacca,E. Breelyn Icard

## 2023-03-13 NOTE — Anesthesia Preprocedure Evaluation (Addendum)
Anesthesia Evaluation  Patient identified by MRN, date of birth, ID band Patient awake    Reviewed: Allergy & Precautions, NPO status , Patient's Chart, lab work & pertinent test results, reviewed documented beta blocker date and time   History of Anesthesia Complications (+) history of anesthetic complications (slow to awaken and wean from O2 after cholecystectomy)  Airway Mallampati: IV  TM Distance: <3 FB Neck ROM: Full    Dental  (+) Dental Advisory Given   Pulmonary former smoker   breath sounds clear to auscultation       Cardiovascular hypertension, Pt. on medications and Pt. on home beta blockers (-) angina + DVT  + dysrhythmias Atrial Fibrillation  Rhythm:Irregular Rate:Normal  10/2022 ECHO: EF 55 to 60%. 1. Left ventricular EF 57 %. The left ventricle has normal function. The left ventricle has no regional wall motion abnormalities.  Grade I diastolic dysfunction (impaired  relaxation). The average left ventricular global longitudinal strain is -20.9 %. The global longitudinal strain is normal.   2. RVF is normal. The right ventricular size is normal.   3. The mitral valve is normal in structure. No evidence of mitral valve regurgitation. No evidence of mitral stenosis.   4. The aortic valve is normal in structure. Aortic valve regurgitation is not visualized. No aortic stenosis is present.     Neuro/Psych negative neurological ROS     GI/Hepatic Neg liver ROS,GERD  Medicated and Controlled,,  Endo/Other  BMI 30.5  Renal/GU negative Renal ROS     Musculoskeletal   Abdominal   Peds  Hematology Eliquis Factor V Leiden: DVT x2   Anesthesia Other Findings   Reproductive/Obstetrics                             Anesthesia Physical Anesthesia Plan  ASA: 3  Anesthesia Plan: General   Post-op Pain Management: Tylenol PO (pre-op)*   Induction: Intravenous  PONV Risk Score and Plan:  2 and Ondansetron and Dexamethasone  Airway Management Planned: Oral ETT and Video Laryngoscope Planned  Additional Equipment: None  Intra-op Plan:   Post-operative Plan: Extubation in OR  Informed Consent: I have reviewed the patients History and Physical, chart, labs and discussed the procedure including the risks, benefits and alternatives for the proposed anesthesia with the patient or authorized representative who has indicated his/her understanding and acceptance.     Dental advisory given  Plan Discussed with: CRNA and Surgeon  Anesthesia Plan Comments:         Anesthesia Quick Evaluation

## 2023-03-13 NOTE — Anesthesia Procedure Notes (Addendum)
Procedure Name: Intubation Date/Time: 03/13/2023 11:25 AM  Performed by: Alwyn Ren, CRNAPre-anesthesia Checklist: Patient identified, Emergency Drugs available, Suction available and Patient being monitored Patient Re-evaluated:Patient Re-evaluated prior to induction Oxygen Delivery Method: Circle system utilized Preoxygenation: Pre-oxygenation with 100% oxygen Induction Type: IV induction Ventilation: Two handed mask ventilation required and Oral airway inserted - appropriate to patient size Laryngoscope Size: Glidescope and 4 Grade View: Grade III Tube type: Oral Tube size: 7.5 mm Number of attempts: 1 Airway Equipment and Method: Oral airway and Rigid stylet Placement Confirmation: ETT inserted through vocal cords under direct vision, positive ETCO2 and breath sounds checked- equal and bilateral Secured at: 23 cm Tube secured with: Tape Dental Injury: Teeth and Oropharynx as per pre-operative assessment  Comments: Pt assumed to be difficult intubation in pre op by Mallampati scoring and Dr. Jean Rosenthal assessment. No known history per patient of difficult airway and unable to find any anesthetic records to indicate such. Glidescope x 1 by Dr. Jean Rosenthal with successful (anterior) intubation, however, mouth opening very small and cuff unable to retain air. Eschmann stylet passed through current ET tube and tube removed with second tube passed over (tube exchange). Second tube passed easily and retained air in cuff.

## 2023-03-13 NOTE — Transfer of Care (Signed)
Immediate Anesthesia Transfer of Care Note  Patient: Jabari Swoveland  Procedure(s) Performed: ATRIAL FIBRILLATION ABLATION  Patient Location: PACU  Anesthesia Type:General  Level of Consciousness: awake, alert , and oriented  Airway & Oxygen Therapy: Patient Spontanous Breathing  Post-op Assessment: Report given to RN and Post -op Vital signs reviewed and stable  Post vital signs: Reviewed and stable  Last Vitals:  Vitals Value Taken Time  BP 138/85 03/13/23 1304  Temp    Pulse 95 03/13/23 1306  Resp 11 03/13/23 1306  SpO2 94 % 03/13/23 1306  Vitals shown include unfiled device data.  Last Pain:  Vitals:   03/13/23 1014  TempSrc:   PainSc: 0-No pain         Complications: No notable events documented.

## 2023-03-13 NOTE — Discharge Instructions (Signed)

## 2023-03-13 NOTE — H&P (Signed)
  Electrophysiology Office Note:    Date:  03/13/2023   ID:  Frank Clark, DOB 23-Dec-1950, MRN [id number]  PCP:  Paulina Fusi, MD   [facility] Providers Cardiologist:  None Electrophysiologist:  Maurice Small, MD     Referring MD: No ref. provider found   History of Present Illness:    Frank Clark is a 72 y.o. male with a medical history significant for paroxysmal atrial fibrillation, factor V Leiden deficiency (2 DVTs), hypertension, referred for AF management.     He was diagnosed with atrial fibrillation years ago.  In the past episodes were triggered by bronchitis and steroids.  He has not had palpitations, but he does feel severely fatigued.    He has been noticing increasing frequency of atrial fibrillation.  He was referred for A-fib ablation and started on flecainide as a temporizing measure.   His wife is a Engineer, civil (consulting).     Reports that he is doing very well today. After starting flecainide, he has not had any symptomatic recurrence of atrial fibrillation.He has had dizzy episodes with this medication.  I reviewed the patient's CT and labs. There was no LAA thrombus. he  has not missed any doses of anticoagulation, and he took his dose last night. There have been no changes in the patient's diagnoses, medications, or condition since our recent clinic visit.   EKGs/Labs/Other Studies Reviewed Today:    Echocardiogram:  TTE October 10, 2022 EF 55-60%; normal atrial sizes   Monitors:  Zio Monitor 10/2020 - my interpretation Predominantly sinus rhythm HR 47-167, avg 71 AF burden 5%: HR 47-146, avg 85  Episodes of SVT occurred consistent with atrial tachycardia PVC burden 6.4%  Stress testing:  ETT 11/13/2022 10 mets achieved. No evidence of ischemia  Advanced imaging:   Cardiac catherization   EKG:         Physical Exam:    VS:  BP (!) 145/93   Pulse 64   Temp 97.7 F (36.5 C) (Temporal)   Resp 18   Ht 6' (1.829 m)   Wt 102.1 kg    SpO2 99%   BMI 30.52 kg/m     Wt Readings from Last 3 Encounters:  03/13/23 102.1 kg  01/24/23 103 kg  10/30/22 102.1 kg     GEN: Well nourished, well developed in no acute distress CARDIAC: RRR, no murmurs, rubs, gallops RESPIRATORY:  Normal work of breathing MUSCULOSKELETAL: no edema    ASSESSMENT & PLAN:    Atrial fibrillation ECG 4/24 shows 5% burden of AF with rates controlled on average He is symptomatic with fatigue and neck discomfort  We discussed the indication, rationale, logistics, anticipated benefits, and potential risks of the ablation procedure including but not limited to -- bleed at the groin access site, chest pain, damage to nearby organs such as the diaphragm, lungs, or esophagus, need for a drainage tube, or prolonged hospitalization. I explained that the risk for stroke, heart attack, need for open chest surgery, or even death is very low but not zero. he  expressed understanding and wishes to proceed.   Secondary hypercoagulable state Continue apixaban 5 mg twice daily  PVCs Burden low at 6.4%     Signed, Maurice Small, MD  03/13/2023 10:49 AM    [facility]

## 2023-03-14 ENCOUNTER — Encounter (HOSPITAL_COMMUNITY): Payer: Self-pay | Admitting: Cardiovascular Disease

## 2023-03-15 ENCOUNTER — Encounter: Payer: Self-pay | Admitting: Cardiovascular Disease

## 2023-03-18 MED FILL — Fentanyl Citrate Preservative Free (PF) Inj 100 MCG/2ML: INTRAMUSCULAR | Qty: 2 | Status: AC

## 2023-03-20 ENCOUNTER — Inpatient Hospital Stay (HOSPITAL_COMMUNITY)
Admission: RE | Admit: 2023-03-20 | Discharge: 2023-03-20 | Discharge status: Home / Self Care | Payer: Medicare Other | Source: Ambulatory Visit | Attending: Internal Medicine | Admitting: Internal Medicine

## 2023-03-20 VITALS — BP 130/76 | HR 71 | Ht 72.0 in | Wt 226.2 lb

## 2023-03-20 DIAGNOSIS — I4891 Unspecified atrial fibrillation: Secondary | ICD-10-CM

## 2023-03-20 DIAGNOSIS — R9431 Abnormal electrocardiogram [ECG] [EKG]: Secondary | ICD-10-CM | POA: Diagnosis not present

## 2023-03-20 DIAGNOSIS — Z6830 Body mass index (BMI) 30.0-30.9, adult: Secondary | ICD-10-CM | POA: Diagnosis not present

## 2023-03-20 DIAGNOSIS — I10 Essential (primary) hypertension: Secondary | ICD-10-CM | POA: Insufficient documentation

## 2023-03-20 DIAGNOSIS — Z7901 Long term (current) use of anticoagulants: Secondary | ICD-10-CM | POA: Diagnosis not present

## 2023-03-20 DIAGNOSIS — E669 Obesity, unspecified: Secondary | ICD-10-CM | POA: Diagnosis not present

## 2023-03-20 DIAGNOSIS — D6869 Other thrombophilia: Secondary | ICD-10-CM | POA: Insufficient documentation

## 2023-03-20 DIAGNOSIS — I480 Paroxysmal atrial fibrillation: Secondary | ICD-10-CM | POA: Diagnosis not present

## 2023-03-20 DIAGNOSIS — D6851 Activated protein C resistance: Secondary | ICD-10-CM | POA: Diagnosis present

## 2023-03-20 NOTE — Progress Notes (Addendum)
Primary Care Physician: Paulina Fusi, MD Primary Cardiologist: none Primary Electrophysiologist: Dr Graciela Husbands Referring Physician: Dr Nelly Laurence   Frank Clark is a 72 y.o. male with a history of Factor V deficiency, HTN, atrial fibrillation who presents for follow up in the [facility]. Patient is on Eliquis for a CHADS2VASC score of 2. He did have one episode of afib several weeks ago after working in the yard. His afib resolved after cooling off inside. Patient reports that heat/dehydration has been a trigger for him in the past. No bleeding issues on anticoagulation.   On follow up 03/20/23, he  is currently in NSR. S/p Afib ablation on 03/13/23 by Dr. Nelly Laurence. He has noted some episodes of Afib since ablation. He specifically had an episode that lasted for several days immediately after the ablation. Currently, they note possibly brief less than 2 minute episodes of Afib and it is improving. No chest pain, SOB, or trouble swallowing. Leg sites are healing and he does not pea-sized area in groin that is stable. No missed doses of anticoagulant.  Today, he denies symptoms of orthopnea, PND, lower extremity edema, dizziness, presyncope, syncope, snoring, daytime somnolence, bleeding, or neurologic sequela. The patient is tolerating medications without difficulties and is otherwise without complaint today.    Atrial Fibrillation Risk Factors:  he does not have symptoms or diagnosis of sleep apnea. he does not have a history of rheumatic fever.   he has a BMI of Body mass index is 30.68 kg/m.Marland Kitchen Filed Weights   03/20/23 1047  Weight: 102.6 kg     Family History  Problem Relation Age of Onset   Stroke Father    Cancer Mother    Coronary artery disease Brother    Colon cancer Neg Hx     Atrial Fibrillation Management history:  Previous antiarrhythmic drugs: flecainide Previous cardioversions: none Previous ablations: 03/13/23 CHADS2VASC score:  2 Anticoagulation history: Eliquis   Past Medical History:  Diagnosis Date   Atrial fibrillation (HCC)    assoc with diverticulitis and RMSF   Complication of anesthesia 02/24/2011   Hard to wake up after Gallbladder   Diverticulitis    DVT, lower extremity (HCC)    left   Factor V deficiency (HCC)    PVC (premature ventricular contraction)    RMSF [facility] spotted fever)    Shortness of breath    Past Surgical History:  Procedure Laterality Date   ATRIAL FIBRILLATION ABLATION N/A 03/13/2023   Procedure: ATRIAL FIBRILLATION ABLATION;  Surgeon: Maurice Small, MD;  Location: [facility] INVASIVE CV LAB;  Service: Cardiovascular;  Laterality: N/A;   CHOLECYSTECTOMY     INGUINAL HERNIA REPAIR     left   LUMBAR LAMINECTOMY  2005   TEE WITHOUT CARDIOVERSION  04/23/2011   Procedure: TRANSESOPHAGEAL ECHOCARDIOGRAM (TEE);  Surgeon: Marca Ancona, MD;  Location: [facility] ENDOSCOPY;  Service: Cardiovascular;  Laterality: N/A;   TONSILLECTOMY      Current Outpatient Medications  Medication Sig Dispense Refill   apixaban (ELIQUIS) 5 MG TABS tablet Take 1 tablet (5 mg total) by mouth 2 (two) times daily. 180 tablet 3   nebivolol (BYSTOLIC) 5 MG tablet Take 1 tablet (5 mg total) by mouth daily. 90 tablet 1   pantoprazole (PROTONIX) 40 MG tablet Take 40 mg by mouth at bedtime.     Phenylephrine-Acetaminophen (TYLENOL SINUS+HEADACHE PO) Take 2 tablets by mouth as needed (sinus headaches).     traMADol (ULTRAM) 50 MG tablet Take 100 mg by  mouth as needed for moderate pain (pain score 4-6) or severe pain (pain score 7-10).     No current facility-administered medications for this encounter.    Allergies  Allergen Reactions   Sulfonamide Derivatives Itching and Rash    REACTION: Reaction not know   ROS- All systems are reviewed and negative except as per the HPI above.  Physical Exam: Vitals:   03/20/23 1047  BP: 130/76  Pulse: 71  Weight: 102.6 kg  Height: 6' (1.829 m)    GEN- The  patient is well appearing, alert and oriented x 3 today.   Neck - no JVD or carotid bruit noted Lungs- Clear to ausculation bilaterally, normal work of breathing Heart- Regular rate and rhythm, no murmurs, rubs or gallops, PMI not laterally displaced Extremities- no clubbing, cyanosis, or edema Skin - no rash or ecchymosis noted   Wt Readings from Last 3 Encounters:  03/20/23 102.6 kg  03/13/23 102.1 kg  01/24/23 103 kg    EKG today demonstrates  Vent. rate 71 BPM PR interval 160 ms QRS duration 84 ms QT/QTcB 370/402 ms P-R-T axes 20 -42 6 Normal sinus rhythm Left axis deviation Inferior infarct , age undetermined Cannot rule out Anterior infarct , age undetermined Abnormal ECG When compared with ECG of 13-Mar-2023 13:16, PREVIOUS ECG IS PRESENT  Echo 07/01/18 demonstrated   1. The left ventricle has normal systolic function, with an ejection  fraction of 55-60%. The cavity size was normal. Left ventricular diastolic parameters were normal.   2. The right ventricle has normal systolic function. The cavity was  normal. There is no increase in right ventricular wall thickness.   3. The mitral valve is normal in structure.   4. The tricuspid valve is normal in structure.   5. The aortic valve is normal in structure.   6. The pulmonic valve was normal in structure.   Epic records are reviewed at length today  CHA2DS2-VASc Score =   2 The patient's score is based upon:         ASSESSMENT AND PLAN: 1. Paroxysmal Atrial Fibrillation (ICD10:  I48.0) The patient's CHA2DS2-VASc score is  2, indicating a  2.2% annual risk of stroke.   Continue Eliquis 5 mg BID Continue nebivolol 5 mg daily S/p Afib ablation on 03/13/23 by Dr. Nelly Laurence.   He is currently in NSR. Reassurance provided about paroxysmal Afib during blanking period. Continue current medications. We discussed the choice of restarting flecainide and after discussion with wife and patient they decline due to what appears  to improving paroxysmal episodes. They have a Kardiamobile and keeping an eye on his rhythm at home. Instructed to call us if episodes worsen/lengthen and we can restart flecainide. They voiced understanding and will do so if burden increases.  2. Secondary Hypercoagulable State (ICD10:  D68.69) The patient is at significant risk for stroke/thromboembolism based upon his CHA2DS2-VASc Score of 2 .  Continue Apixaban (Eliquis).   3. Obesity Body mass index is 30.68 kg/m. Lifestyle modification was discussed at length including regular exercise and weight reduction.  4. HTN Stable, no changes today.   Follow up as scheduled with Afib clinic.    Justin Mend, PA-C [facility] [address] [place], [place] [address] [phone] 03/20/2023 11:34 AM

## 2023-03-25 DIAGNOSIS — E785 Hyperlipidemia, unspecified: Secondary | ICD-10-CM | POA: Diagnosis not present

## 2023-03-25 DIAGNOSIS — R7301 Impaired fasting glucose: Secondary | ICD-10-CM | POA: Diagnosis not present

## 2023-03-25 DIAGNOSIS — K219 Gastro-esophageal reflux disease without esophagitis: Secondary | ICD-10-CM | POA: Diagnosis not present

## 2023-03-25 DIAGNOSIS — Z125 Encounter for screening for malignant neoplasm of prostate: Secondary | ICD-10-CM | POA: Diagnosis not present

## 2023-03-25 DIAGNOSIS — I480 Paroxysmal atrial fibrillation: Secondary | ICD-10-CM | POA: Diagnosis not present

## 2023-03-25 DIAGNOSIS — D682 Hereditary deficiency of other clotting factors: Secondary | ICD-10-CM | POA: Diagnosis not present

## 2023-03-25 DIAGNOSIS — Z86718 Personal history of other venous thrombosis and embolism: Secondary | ICD-10-CM | POA: Diagnosis not present

## 2023-04-09 ENCOUNTER — Ambulatory Visit (HOSPITAL_COMMUNITY)
Admission: RE | Admit: 2023-04-09 | Discharge: 2023-04-09 | Disposition: A | Discharge status: Home / Self Care | Payer: Medicare Other | Source: Ambulatory Visit | Attending: Internal Medicine | Admitting: Internal Medicine

## 2023-04-09 VITALS — BP 122/72 | HR 60 | Ht 72.0 in | Wt 229.8 lb

## 2023-04-09 DIAGNOSIS — Z7901 Long term (current) use of anticoagulants: Secondary | ICD-10-CM | POA: Insufficient documentation

## 2023-04-09 DIAGNOSIS — Z79899 Other long term (current) drug therapy: Secondary | ICD-10-CM | POA: Insufficient documentation

## 2023-04-09 DIAGNOSIS — Z6831 Body mass index (BMI) 31.0-31.9, adult: Secondary | ICD-10-CM | POA: Diagnosis not present

## 2023-04-09 DIAGNOSIS — Z7182 Exercise counseling: Secondary | ICD-10-CM | POA: Diagnosis not present

## 2023-04-09 DIAGNOSIS — E669 Obesity, unspecified: Secondary | ICD-10-CM | POA: Insufficient documentation

## 2023-04-09 DIAGNOSIS — I4891 Unspecified atrial fibrillation: Secondary | ICD-10-CM | POA: Diagnosis not present

## 2023-04-09 DIAGNOSIS — D6869 Other thrombophilia: Secondary | ICD-10-CM | POA: Diagnosis not present

## 2023-04-09 DIAGNOSIS — I10 Essential (primary) hypertension: Secondary | ICD-10-CM | POA: Diagnosis not present

## 2023-04-09 DIAGNOSIS — I480 Paroxysmal atrial fibrillation: Secondary | ICD-10-CM

## 2023-04-09 NOTE — Progress Notes (Signed)
Primary Care Physician: Paulina Fusi, MD Primary Cardiologist: none Primary Electrophysiologist: Dr Graciela Husbands Referring Physician: Dr Nelly Laurence   Frank Clark is a 72 y.o. male with a history of Factor V deficiency, HTN, atrial fibrillation who presents for follow up in the [facility]. Patient is on Eliquis for a CHADS2VASC score of 2. He did have one episode of afib several weeks ago after working in the yard. His afib resolved after cooling off inside. Patient reports that heat/dehydration has been a trigger for him in the past. No bleeding issues on anticoagulation.   On follow up 03/20/23, he  is currently in NSR. S/p Afib ablation on 03/13/23 by Dr. Nelly Laurence. He has noted some episodes of Afib since ablation. He specifically had an episode that lasted for several days immediately after the ablation. Currently, they note possibly brief less than 2 minute episodes of Afib and it is improving. No chest pain, SOB, or trouble swallowing. Leg sites are healing and he does not pea-sized area in groin that is stable. No missed doses of anticoagulant.  On follow up 04/09/23, he is currently in NSR. He is doing well since last office visit. No Afib episodes. No missed doses of anticoagulant. His incision sites have healed well.   Today, he denies symptoms of orthopnea, PND, lower extremity edema, dizziness, presyncope, syncope, snoring, daytime somnolence, bleeding, or neurologic sequela. The patient is tolerating medications without difficulties and is otherwise without complaint today.    Atrial Fibrillation Risk Factors:  he does not have symptoms or diagnosis of sleep apnea. he does not have a history of rheumatic fever.   he has a BMI of Body mass index is 31.17 kg/m.Marland Kitchen Filed Weights   04/09/23 1356  Weight: 104.2 kg     Family History  Problem Relation Age of Onset   Stroke Father    Cancer Mother    Coronary artery disease Brother    Colon cancer Neg  Hx     Atrial Fibrillation Management history:  Previous antiarrhythmic drugs: flecainide Previous cardioversions: none Previous ablations: 03/13/23 CHADS2VASC score: 2 Anticoagulation history: Eliquis   Past Medical History:  Diagnosis Date   Atrial fibrillation (HCC)    assoc with diverticulitis and RMSF   Complication of anesthesia 02/24/2011   Hard to wake up after Gallbladder   Diverticulitis    DVT, lower extremity (HCC)    left   Factor V deficiency (HCC)    PVC (premature ventricular contraction)    RMSF [facility] spotted fever)    Shortness of breath    Past Surgical History:  Procedure Laterality Date   ATRIAL FIBRILLATION ABLATION N/A 03/13/2023   Procedure: ATRIAL FIBRILLATION ABLATION;  Surgeon: Maurice Small, MD;  Location: [facility] INVASIVE CV LAB;  Service: Cardiovascular;  Laterality: N/A;   CHOLECYSTECTOMY     INGUINAL HERNIA REPAIR     left   LUMBAR LAMINECTOMY  2005   TEE WITHOUT CARDIOVERSION  04/23/2011   Procedure: TRANSESOPHAGEAL ECHOCARDIOGRAM (TEE);  Surgeon: Marca Ancona, MD;  Location: [facility] ENDOSCOPY;  Service: Cardiovascular;  Laterality: N/A;   TONSILLECTOMY      Current Outpatient Medications  Medication Sig Dispense Refill   apixaban (ELIQUIS) 5 MG TABS tablet Take 1 tablet (5 mg total) by mouth 2 (two) times daily. 180 tablet 3   nebivolol (BYSTOLIC) 5 MG tablet Take 1 tablet (5 mg total) by mouth daily. 90 tablet 1   pantoprazole (PROTONIX) 40 MG tablet Take 40 mg by  mouth at bedtime.     Phenylephrine-Acetaminophen (TYLENOL SINUS+HEADACHE PO) Take 2 tablets by mouth as needed (sinus headaches).     traMADol (ULTRAM) 50 MG tablet Take 100 mg by mouth as needed for moderate pain (pain score 4-6) or severe pain (pain score 7-10).     No current facility-administered medications for this encounter.    Allergies  Allergen Reactions   Sulfonamide Derivatives Itching and Rash    REACTION: Reaction not know   ROS- All systems are  reviewed and negative except as per the HPI above.  Physical Exam: Vitals:   04/09/23 1356  BP: 122/72  Pulse: 60  Weight: 104.2 kg  Height: 6' (1.829 m)    GEN- The patient is well appearing, alert and oriented x 3 today.   Neck - no JVD or carotid bruit noted Lungs- Clear to ausculation bilaterally, normal work of breathing Heart- Regular rate and rhythm, no murmurs, rubs or gallops, PMI not laterally displaced Extremities- no clubbing, cyanosis, or edema Skin - no rash or ecchymosis noted   Wt Readings from Last 3 Encounters:  04/09/23 104.2 kg  03/20/23 102.6 kg  03/13/23 102.1 kg    EKG today demonstrates  Vent. rate 60 BPM PR interval 158 ms QRS duration 86 ms QT/QTcB 406/406 ms P-R-T axes 32 -37 28 Normal sinus rhythm with sinus arrhythmia Left axis deviation Inferior infarct , age undetermined Abnormal ECG When compared with ECG of 20-Mar-2023 11:06, PREVIOUS ECG IS PRESENT  Echo 07/01/18 demonstrated   1. The left ventricle has normal systolic function, with an ejection  fraction of 55-60%. The cavity size was normal. Left ventricular diastolic parameters were normal.   2. The right ventricle has normal systolic function. The cavity was  normal. There is no increase in right ventricular wall thickness.   3. The mitral valve is normal in structure.   4. The tricuspid valve is normal in structure.   5. The aortic valve is normal in structure.   6. The pulmonic valve was normal in structure.   Epic records are reviewed at length today  CHA2DS2-VASc Score =   2 The patient's score is based upon:         ASSESSMENT AND PLAN: 1. Paroxysmal Atrial Fibrillation (ICD10:  I48.0) The patient's CHA2DS2-VASc score is  2, indicating a  2.2% annual risk of stroke.   Continue Eliquis 5 mg BID Continue nebivolol 5 mg daily S/p Afib ablation on 03/13/23 by Dr. Nelly Laurence.   He is currently in NSR. He is doing well. No changes at this time.  2. Secondary  Hypercoagulable State (ICD10:  D68.69) The patient is at significant risk for stroke/thromboembolism based upon his CHA2DS2-VASc Score of 2 .  Continue Apixaban (Eliquis).   3. Obesity Body mass index is 31.17 kg/m. Lifestyle modification was discussed at length including regular exercise and weight reduction.  4. HTN Stable, no changes today.   Follow up as scheduled with EP.   Justin Mend, PA-C [facility] [address] [place], [place] [address] [phone] 04/09/2023 2:16 PM

## 2023-04-10 ENCOUNTER — Encounter (HOSPITAL_COMMUNITY): Payer: Self-pay

## 2023-04-10 ENCOUNTER — Ambulatory Visit (HOSPITAL_COMMUNITY): Payer: Medicare Other | Admitting: Physician Assistant

## 2023-06-12 NOTE — Progress Notes (Signed)
 Cardiology Office Note Date:  06/12/2023  Patient ID:  Frank Clark, Frank Clark Jan 09, 1951, MRN [id number] PCP:  Keren Vicenta BRAVO, MD  Electrophysiologist: Dr. Fernande    Chief Complaint:   post ablation  History of Present Illness: Frank Clark is a 73 y.o. male with history of AFib, Factor V Leiden, DVT (2009), HTN, PVCs  He saw Dr. Fernande 06/06/21, with infrequent AFib did not think more aggressive rhythm control (ablation) strategies were needed, though if more, would increase his BB and refer/consider ablation.  He saw the Afib clinic 12/12/21, had an episode of AFib after/provoked by working in the yard/heat several weeks prior, this apparently a known trigger  Seen by myself, Dr. Fernande in the interim   Ultimately referred for AFib ablation  Saw Dr. Nancey 01/24/23 feeling better on flecainide , planned for ablation  PVI ablation 03/13/23 >> flecainide  stopped   Saw Afib clinic as usual, 03/20/23, initially had quite a bit of Afib initially post ablation > improving at the time of this visit  F/u AFib clinic visit 04/09/23, doing well, maintaining SR  TODAY  He is accompanied by his wife AFib has settled well, none further that he has been aware of Feels quite well No CP, SOB, DOE He denies any near syncope or syncope No bleeding, signs of bleeding Active, spent an afternoon a couple weeks ago splitting wood, felt well.  Procedure sites healed well  AFib hx Diagnosed  2010 associated with bout of diverticulitis Flecainide  started June 2024 >> stopped post ablation AFib ablation 03/13/23 (PFA)  Past Medical History:  Diagnosis Date   Atrial fibrillation (HCC)    assoc with diverticulitis and RMSF   Complication of anesthesia 02/24/2011   Hard to wake up after Gallbladder   Diverticulitis    DVT, lower extremity (HCC)    left   Factor V deficiency (HCC)    PVC (premature ventricular contraction)    RMSF [facility] spotted fever)    Shortness of breath      Past Surgical History:  Procedure Laterality Date   ATRIAL FIBRILLATION ABLATION N/A 03/13/2023   Procedure: ATRIAL FIBRILLATION ABLATION;  Surgeon: Nancey Eulas BRAVO, MD;  Location: [facility] INVASIVE CV LAB;  Service: Cardiovascular;  Laterality: N/A;   CHOLECYSTECTOMY     INGUINAL HERNIA REPAIR     left   LUMBAR LAMINECTOMY  2005   TEE WITHOUT CARDIOVERSION  04/23/2011   Procedure: TRANSESOPHAGEAL ECHOCARDIOGRAM (TEE);  Surgeon: Ezra Shuck, MD;  Location: [facility] ENDOSCOPY;  Service: Cardiovascular;  Laterality: N/A;   TONSILLECTOMY      Current Outpatient Medications  Medication Sig Dispense Refill   apixaban  (ELIQUIS ) 5 MG TABS tablet Take 1 tablet (5 mg total) by mouth 2 (two) times daily. 180 tablet 3   nebivolol  (BYSTOLIC ) 5 MG tablet Take 1 tablet (5 mg total) by mouth daily. 90 tablet 1   pantoprazole  (PROTONIX ) 40 MG tablet Take 40 mg by mouth at bedtime.     Phenylephrine -Acetaminophen  (TYLENOL  SINUS+HEADACHE PO) Take 2 tablets by mouth as needed (sinus headaches).     traMADol  (ULTRAM ) 50 MG tablet Take 100 mg by mouth as needed for moderate pain (pain score 4-6) or severe pain (pain score 7-10).     No current facility-administered medications for this visit.    Allergies:   Sulfonamide derivatives   Social History:  The patient  reports that he quit smoking about 45 years ago. His smoking use included cigarettes. He has never used smokeless tobacco. He  reports that he does not drink alcohol and does not use drugs.   Family History:  The patient's family history includes Cancer in his mother; Coronary artery disease in his brother; Stroke in his father.  ROS:  Please see the history of present illness.    All other systems are reviewed and otherwise negative.   PHYSICAL EXAM:  VS:  There were no vitals taken for this visit. BMI: There is no height or weight on file to calculate BMI. Well nourished, well developed, in no acute distress HEENT: normocephalic,  atraumatic Neck: no JVD, carotid bruits or masses Cardiac:  RRR; no significant murmurs, no rubs, or gallops Lungs:  CTA b/l, no wheezing, rhonchi or rales Abd: soft, nontender MS: no deformity or  atrophy Ext: no edema Skin: warm and dry, no rash Neuro:  No gross deficits appreciated Psych: euthymic mood, full affect   EKG:  Done today and reviewed by myself shows  SR 60bpm, LAD  03/13/23: EPS/ablation CONCLUSIONS: 1. Sinus rhythm upon presentation.   2. Successful electrical ablation of all four pulmonary veins with pulse field energy. 3. No inducible arrhythmias following ablation  4. No early apparent complications.   02/27/23: Cardiac CT IMPRESSION: 1.  Mild bi atrial enlargement No LAA thrombus 2.  Normal PV anatomy see measurements above 3.  Normal ascending thoracic aorta 3.6 cm 4.  No pericardial effusion 5.  No ASD/PFO 6. coronary  calcium score of 0   ETT 11/13/2022 10 mets achieved. No evidence of ischemia  TTE October 10, 2022 EF 55-60%; normal atrial sizes   Zio Monitor 10/2020 - my interpretation Predominantly sinus rhythm HR 47-167, avg 71 AF burden 5%: HR 47-146, avg 85  Episodes of SVT occurred consistent with atrial tachycardia PVC burden 6.4%  07/01/2018: TTE 1. The left ventricle has normal systolic function, with an ejection  fraction of 55-60%. The cavity size was normal. Left ventricular diastolic  parameters were normal.   2. The right ventricle has normal systolic function. The cavity was  normal. There is no increase in right ventricular wall thickness.   3. The mitral valve is normal in structure.   4. The tricuspid valve is normal in structure.   5. The aortic valve is normal in structure.   6. The pulmonic valve was normal in structure.   Recent Labs: 02/25/2023: BUN 17; Creatinine, Ser 1.06; Hemoglobin 16.3; Platelets 149; Potassium 4.7; Sodium 142  No results found for requested labs within last 365 days.   CrCl cannot be calculated  (Patient's most recent lab result is older than the maximum 21 days allowed.).   Wt Readings from Last 3 Encounters:  04/09/23 229 lb 12.8 oz (104.2 kg)  03/20/23 226 lb 3.2 oz (102.6 kg)  03/13/23 225 lb (102.1 kg)     Other studies reviewed: Additional studies/records reviewed today include: summarized above  ASSESSMENT AND PLAN:  Persistent Afib CHA2DS2Vasc is 4, on eliquis ,  appropriately dosed Hx of factor V Leiden and DVT No symptoms of Afib   PVCs None again today  HTN Looks great  4. Secondary hypercoagulable state    Disposition: back in 6 mo, sooner if needed   Current medicines are reviewed at length with the patient today.  The patient did not have any concerns regarding medicines.  Bonney Charlies Arthur, PA-C 06/12/2023 5:10 PM     [facility] [address] [place] [address] [phone] (office)  [phone] (fax)

## 2023-06-13 ENCOUNTER — Encounter: Payer: Self-pay | Admitting: Physician Assistant

## 2023-06-13 ENCOUNTER — Ambulatory Visit: Payer: Medicare Other | Attending: Physician Assistant | Admitting: Physician Assistant

## 2023-06-13 ENCOUNTER — Other Ambulatory Visit: Payer: Self-pay | Admitting: *Deleted

## 2023-06-13 VITALS — BP 124/78 | HR 60 | Ht 72.0 in | Wt 226.8 lb

## 2023-06-13 DIAGNOSIS — I10 Essential (primary) hypertension: Secondary | ICD-10-CM | POA: Insufficient documentation

## 2023-06-13 DIAGNOSIS — I480 Paroxysmal atrial fibrillation: Secondary | ICD-10-CM | POA: Insufficient documentation

## 2023-06-13 DIAGNOSIS — D6869 Other thrombophilia: Secondary | ICD-10-CM | POA: Diagnosis not present

## 2023-06-13 DIAGNOSIS — I493 Ventricular premature depolarization: Secondary | ICD-10-CM | POA: Insufficient documentation

## 2023-06-13 NOTE — Patient Instructions (Signed)
 Medication Instructions:   Your physician recommends that you continue on your current medications as directed. Please refer to the Current Medication list given to you today.   *If you need a refill on your cardiac medications before your next appointment, please call your pharmacy*   Lab Work:  NONE ORDERED  TODAY   If you have labs (blood work) drawn today and your tests are completely normal, you will receive your results only by: MyChart Message (if you have MyChart) OR A paper copy in the mail If you have any lab test that is abnormal or we need to change your treatment, we will call you to review the results.   Testing/Procedures:  NONE ORDERED  TODAY    Follow-Up: At [facility], you and your health needs are our priority.  As part of our continuing mission to provide you with exceptional heart care, we have created designated Provider Care Teams.  These Care Teams include your primary Cardiologist (physician) and Advanced Practice Providers (APPs -  Physician Assistants and Nurse Practitioners) who all work together to provide you with the care you need, when you need it.  We recommend signing up for the patient portal called MyChart.  Sign up information is provided on this After Visit Summary.  MyChart is used to connect with patients for Virtual Visits (Telemedicine).  Patients are able to view lab/test results, encounter notes, upcoming appointments, etc.  Non-urgent messages can be sent to your provider as well.   To learn more about what you can do with MyChart, go to [email or web address].    Your next appointment:    6 month(s)  Provider:   Elspeth Sage, MD, Eulas Furbish, MD, or Charlies Arthur, [place]    Other Instructions

## 2023-06-25 ENCOUNTER — Ambulatory Visit: Payer: Medicare Other | Admitting: Cardiovascular Disease

## 2023-06-26 ENCOUNTER — Other Ambulatory Visit (HOSPITAL_COMMUNITY): Payer: Medicare Other

## 2023-09-23 DIAGNOSIS — M51379 Other intervertebral disc degeneration, lumbosacral region without mention of lumbar back pain or lower extremity pain: Secondary | ICD-10-CM | POA: Diagnosis not present

## 2023-09-23 DIAGNOSIS — R7301 Impaired fasting glucose: Secondary | ICD-10-CM | POA: Diagnosis not present

## 2023-09-23 DIAGNOSIS — I480 Paroxysmal atrial fibrillation: Secondary | ICD-10-CM | POA: Diagnosis not present

## 2023-09-23 DIAGNOSIS — Z9181 History of falling: Secondary | ICD-10-CM | POA: Diagnosis not present

## 2023-09-23 DIAGNOSIS — E785 Hyperlipidemia, unspecified: Secondary | ICD-10-CM | POA: Diagnosis not present

## 2023-09-23 DIAGNOSIS — K219 Gastro-esophageal reflux disease without esophagitis: Secondary | ICD-10-CM | POA: Diagnosis not present

## 2023-09-23 DIAGNOSIS — D682 Hereditary deficiency of other clotting factors: Secondary | ICD-10-CM | POA: Diagnosis not present

## 2023-09-23 DIAGNOSIS — Z86718 Personal history of other venous thrombosis and embolism: Secondary | ICD-10-CM | POA: Diagnosis not present

## 2023-09-23 DIAGNOSIS — Z79899 Other long term (current) drug therapy: Secondary | ICD-10-CM | POA: Diagnosis not present

## 2023-09-23 DIAGNOSIS — Z1211 Encounter for screening for malignant neoplasm of colon: Secondary | ICD-10-CM | POA: Diagnosis not present

## 2023-09-23 DIAGNOSIS — Z1331 Encounter for screening for depression: Secondary | ICD-10-CM | POA: Diagnosis not present

## 2023-12-11 ENCOUNTER — Encounter: Payer: Self-pay | Admitting: Gastroenterology

## 2023-12-11 ENCOUNTER — Telehealth: Payer: Self-pay

## 2023-12-11 ENCOUNTER — Ambulatory Visit (INDEPENDENT_AMBULATORY_CARE_PROVIDER_SITE_OTHER): Admitting: Gastroenterology

## 2023-12-11 VITALS — BP 128/76 | HR 62 | Ht 71.26 in | Wt 232.1 lb

## 2023-12-11 DIAGNOSIS — R09A2 Foreign body sensation, throat: Secondary | ICD-10-CM | POA: Diagnosis not present

## 2023-12-11 DIAGNOSIS — K219 Gastro-esophageal reflux disease without esophagitis: Secondary | ICD-10-CM

## 2023-12-11 DIAGNOSIS — Z860100 Personal history of colon polyps, unspecified: Secondary | ICD-10-CM

## 2023-12-11 DIAGNOSIS — Z860101 Personal history of adenomatous and serrated colon polyps: Secondary | ICD-10-CM | POA: Diagnosis not present

## 2023-12-11 MED ORDER — NA SULFATE-K SULFATE-MG SULF 17.5-3.13-1.6 GM/177ML PO SOLN: 1.0000 | Freq: Once | ORAL | 0 refills | Status: AC

## 2023-12-11 NOTE — Progress Notes (Signed)
 Agree with the assessment and plan as outlined by [facility], FNP-C.  Carlitos Bottino, DO, [facility]

## 2023-12-11 NOTE — Telephone Encounter (Signed)
 Pharmacy please advise on holding Eliquis  prior to endoscopy procedure scheduled for 01/03/2024. Last labs from [facility] on 09/23/2023. Thank you.

## 2023-12-11 NOTE — Telephone Encounter (Signed)
  [facility] Pre-operative Risk Assessment     Request for surgical clearance:     Endoscopy Procedure  What type of surgery is being performed?     endoscopic  When is this surgery scheduled?     01/03/24  What type of clearance is required ?   Pharmacy  Are there any medications that need to be held prior to surgery and how long? Eliquis   Practice name and name of physician performing surgery?      [facility]  What is your office phone and fax number?      Phone- [phone]  Fax- [phone]  Anesthesia type (None, local, MAC, general) ?       MAC   Please route your response to [employer]

## 2023-12-11 NOTE — Progress Notes (Signed)
 Chief Complaint: colonoscopy Primary GI Doctor:Dr. San  HPI:  Patient is a  73  year old male patient with past medical history of hyperlipidemia, atrial fibrillation (on eliquis ), DVT x 2, Leiden factor V deficiency, GERD, Prediabetes, who was referred to me by Keren Vicenta BRAVO, MD on 09/24/23 for a evaluation of colonoscopy .   Patient last seen in GI office on 05/25/2019 by Elida, NP for GERD and colonoscopy.  06/13/23 cardiac appointment follow-up. Saw Afib clinic, 03/20/23, initially had quite a bit of Afib initially post ablation > improving at the time of this visit.  Interval History   Patient presents for evaluation for colonoscopy, accompanied by his wife. Patient denies altered bowel habits, abdominal pain, or rectal bleeding  Patient has history of GERD and taking pantoprazole  40 mg po daily. No dysphagia. Patient reports globus sensation typically during the night. Patient denies nausea, vomiting, or weight loss.  No alcohol use. Nonsmoker.  Patient on Eliquis  5mg  twice daily for afib. Patient had ablation last November, no episodes since.   Patient's family history includes mother with Bone CA.  Wt Readings from Last 3 Encounters:  12/11/23 232 lb 2 oz (105.3 kg)  06/13/23 226 lb 12.8 oz (102.9 kg)  04/09/23 229 lb 12.8 oz (104.2 kg)    Past Medical History:  Diagnosis Date   Atrial fibrillation (HCC)    assoc with diverticulitis and RMSF   Colon polyps    Complication of anesthesia 02/24/2011   Hard to wake up after Gallbladder   Diverticulitis    DVT, lower extremity (HCC)    left   Factor V deficiency (HCC)    PVC (premature ventricular contraction)    RMSF [facility] spotted fever)    Shortness of breath    Ulcerative colitis (HCC)     Past Surgical History:  Procedure Laterality Date   ATRIAL FIBRILLATION ABLATION N/A 03/13/2023   Procedure: ATRIAL FIBRILLATION ABLATION;  Surgeon: Nancey Eulas BRAVO, MD;  Location: [facility] INV[facility];   Service: Cardiovascular;  Laterality: N/A;   CHOLECYSTECTOMY     DG GALL BLADDER     INGUINAL HERNIA REPAIR Bilateral    LUMBAR LAMINECTOMY  05/08/2003   TEE WITHOUT CARDIOVERSION  04/23/2011   Procedure: TRANSESOPHAGEAL ECHOCARDIOGRAM (TEE);  Surgeon: Ezra Shuck, MD;  Location: [facility] ENDOSCOPY;  Service: Cardiovascular;  Laterality: N/A;   TONSILLECTOMY      Current Outpatient Medications  Medication Sig Dispense Refill   apixaban  (ELIQUIS ) 5 MG TABS tablet Take 1 tablet (5 mg total) by mouth 2 (two) times daily. 180 tablet 3   nebivolol  (BYSTOLIC ) 5 MG tablet Take 1 tablet (5 mg total) by mouth daily. 90 tablet 1   pantoprazole  (PROTONIX ) 40 MG tablet Take 40 mg by mouth at bedtime.     Phenylephrine -Acetaminophen  (TYLENOL  SINUS+HEADACHE PO) Take 2 tablets by mouth as needed (sinus headaches).     traMADol  (ULTRAM ) 50 MG tablet Take 100 mg by mouth as needed for moderate pain (pain score 4-6) or severe pain (pain score 7-10).     No current facility-administered medications for this visit.    Allergies as of 12/11/2023 - Review Complete 12/11/2023  Allergen Reaction Noted   Sulfonamide derivatives Itching and Rash     Family History  Problem Relation Age of Onset   Stroke Father    Cancer Mother    Coronary artery disease Brother    Colon cancer Neg Hx     Review of Systems:    Constitutional:  No weight loss, fever, chills, weakness or fatigue HEENT: Eyes: No change in vision               Ears, Nose, Throat:  No change in hearing or congestion Skin: No rash or itching Cardiovascular: No chest pain, chest pressure or palpitations   Respiratory: No SOB or cough Gastrointestinal: See HPI and otherwise negative Genitourinary: No dysuria or change in urinary frequency Neurological: No headache, dizziness or syncope Musculoskeletal: No new muscle or joint pain Hematologic: No bleeding or bruising Psychiatric: No history of depression or anxiety    Physical Exam:  Vital  signs: BP 128/76 (BP Location: Left Arm, Patient Position: Sitting, Cuff Size: Normal)   Pulse 62   Ht 5' 11.26 (1.81 m) Comment: height without shoes  Wt 232 lb 2 oz (105.3 kg)   BMI 32.14 kg/m   Constitutional:   Pleasant male appears to be in NAD, Well developed, Well nourished, alert and cooperative Throat: Oral cavity and pharynx without inflammation, swelling or lesion.  Respiratory: Respirations even and unlabored. Lungs clear to auscultation bilaterally.   No wheezes, crackles, or rhonchi.  Cardiovascular: Normal S1, S2. Regular rate and rhythm. No peripheral edema, cyanosis or pallor.  Gastrointestinal:  Soft, nondistended, nontender. No rebound or guarding. Normal bowel sounds. No appreciable masses or hepatomegaly. Rectal:  Not performed.  Msk:  Symmetrical without gross deformities. Without edema, no deformity or joint abnormality.  Neurologic:  Alert and  oriented x4;  grossly normal neurologically.  Skin:   Dry and intact without significant lesions or rashes.  RELEVANT LABS AND IMAGING: CBC    Latest Ref Rng & Units 02/25/2023    8:23 AM 10/30/2022    4:19 PM 05/31/2021    7:39 PM  CBC  WBC 3.4 - 10.8 x10E3/uL 7.1  9.8  11.2   Hemoglobin 13.0 - 17.7 g/dL 83.6  84.0  83.8   Hematocrit 37.5 - 51.0 % 49.6  46.4  47.7   Platelets 150 - 450 x10E3/uL 149  186  175      CMP     Latest Ref Rng & Units 02/25/2023    8:27 AM 10/30/2022    4:19 PM 05/31/2021    7:39 PM  CMP  Glucose 70 - 99 mg/dL 897  897  841   BUN 8 - 27 mg/dL 17  21  20    Creatinine 0.76 - 1.27 mg/dL 8.93  8.71  8.81   Sodium 134 - 144 mmol/L 142  140  141   Potassium 3.5 - 5.2 mmol/L 4.7  5.1  4.3   Chloride 96 - 106 mmol/L 104  102  106   CO2 20 - 29 mmol/L 24  26  24    Calcium 8.6 - 10.2 mg/dL 9.5  9.4  9.0      Lab Results  Component Value Date   TSH 1.71 02/22/2010  03/13/2023 labs show: BUN 16, creat 1.11, normal LFT's, wbc 6, hgb 15.3, plt 150  10/2022 echo- Left ventricular ejection  fraction, by estimation, is 55 to 60%.   GI Procedures: 05/2019 EGD/colon with Dr. San, recall 3 years Colonoscopy - Three 2 to 3 mm polyps in the ascending colon and in the cecum, removed with a cold biopsy forceps. Resected and retrieved. - One 2 mm polyp in the sigmoid colon, removed with a cold biopsy forceps. Resected and retrieved. - One 3 mm polyp in the sigmoid colon, located within a diverticula. Biopsied. Tattooed. - Two diminutive polyps in  the rectum, removed with a cold biopsy forceps. Resected and retrieved. - Non- bleeding internal hemorrhoids. - Diverticulosis in the sigmoid colon, in the descending colon, in the transverse colon, in the ascending colon and in the cecum. Path: Surgical [P], colon, cecum and ascending, polyp (2) - SESSILE SERRATED POLYP WITHOUT CYTOLOGIC DYSPLASIA (X 3). 3. Surgical [P], colon, sigmoid, polyp - HYPERPLASTIC POLYP. 4. Surgical [P], colon, sigmoid, polyp - HYPERPLASTIC POLYP. 5. Surgical [P], colon, rectum, polyp (2) - HYPERPLASTIC POLYP (X 2).  EGD - Normal esophagus. Dilated. - Z- line regular, 40 cm from the incisors. - Gastritis. Biopsied. - Normal duodenal bulb, first portion of the duodenum and second portion of the duodenum. Path: Surgical [P], random gastric sites - MILD CHRONIC GASTRITIS. - WARTHIN-STARRY STAIN IS NEGATIVE FOR HELICOBACTER PYLORI.  Assessment: Encounter Diagnoses  Name Primary?   History of colonic polyps Yes   Gastroesophageal reflux disease, unspecified whether esophagitis present    Globus sensation      74 year old male patient with history of sessile serrated polyps due for colon screening colonoscopy, will schedule in [facility] with Dr. San.  Patient is on Eliquis  with history of DVT x 2, Leiden factor V deficiency, Afib, we will make cardiac clearance from cardiologist with specific directions and if bridge therapy is needed.    Patient also complains of globus sensation typically later in the evening  most likely LPR.  Recommend patient take Pepcid at bedtime and avoid eating 2 to 3 hours before laying down.  Plan: - Continue Pantoprazole  40 mg po daily -Add Pepcid at bedtime -Strict GERD diet, no late meals 3-4 hours before lying down -Schedule for a colonoscopy in [facility] with Dr. San. The risks and benefits of colonoscopy with possible polypectomy / biopsies were discussed and the patient agrees to proceed.  -Our office will contact the patient's cardiologist, Dr. Nancey  with [facility] cardiology to verify Eliquis  instructions and possible Lovenox bridge prior to colonoscopy.  Thank you for the courtesy of this consult. Please call me with any questions or concerns.   Wylodean Shimmel, FNP-C [facility] 12/11/2023, 9:43 AM  Cc: Keren Vicenta BRAVO, MD

## 2023-12-11 NOTE — Patient Instructions (Addendum)
 Continue Pantoprazole  40 mg po daily Add Pepcid at bedtime Strict GERD diet, no late meals 3-4 hours before lying down  You are a chronic throat clearer! You are not alone! The causes of chronic throat clearing include acid reflux (laryngopharyngeal reflux), allergies, environmental irritants such as tobacco smoke and air pollution, and asthma. If present for a long time throat clearing can become habit forming. When you clear your throat, you are transferring mucus from your throat up into your mouth and nose. We all secrete up to 2 liters (imagine a big Coke bottle) of mucus a day. This saliva is usually swallowed and ends up in the toilet eventually. By clearing the mucus back into your mouth and nose you are sending the saliva in the wrong direction. This is counterproductive. Unless you are walking around spitting all day (which most throat clearers do not do), the mucus will work its way back down to the throat and eventually be swallowed. Get the mucus going in the right direction. Swallow! Swallow! Swallow! No throat clearing.  Chronic throat clearing is damaging. The trauma from the throat clearing can cause redness and swelling of your vocal cords. If the clearing is very excessive small growths (granulomas) can form. These granulomas can get so large that they can eventually affect your breathing. Surgical removal may be necessary. The irritation and swelling produced by the clearing can cause saliva to sit in your throat. This causes more throat clearing. More throat clearing causes more stagnant mucus which causes more throat clearing, which causes more mucus, etc... A vicious cycle will ensue and the habit can be very difficult to break. Without your help and a conscious effort on your part to break the cycle, the throat clearing will never stop.  Your doctor may prescribe medication and behavioral modifications to treat acid reflux disease. Nose and throat sprays may be prescribed to treat  underlying allergies or asthma. Avoiding possible irritants will be recommended. Without changes to your behavior these treatments will not be successful.   The following alterations are recommended:  Do not clear your throat. Swallow instead. This gets the mucus going in the right direction towards the toilet. Carry around some water to assist with swallowing and mucus clearance. When you feel the urge to clear your throat take a sip of the water. If you absolutely need to clear your throat perform a non-traumatic throat clear. To do this pant with your mouth open and say [place], [place], [place] with a powerful but very breathy voice. This will clear the secretions without causing damage. Increase your water intake. This will thin secretions and make it easier to swallow. Comply with the behavior recommendations for reflux disease. Chew baking soda ([employer]) gum. This can be found on the internet or in the tooth paste isle of your pharmacy. Gum chewing can help with swallowing, reflux, and throat clearing. Chew three pieces a day. If you develop jaw discomfort or headaches decrease the amount of gum chewing. Tell your friends and family to tell you to swallow when you clear your throat. Some people have been clearing so long that they don't even know when they are doing it. Be patient. The urge to clear your throat will not go away overnight. It may take 8 or 12 weeks for the medication and behavior modifications to work.  You have been scheduled for a colonoscopy. Please follow written instructions given to you at your visit today.   If you use inhalers (even only  as needed), please bring them with you on the day of your procedure.  DO NOT TAKE 7 DAYS PRIOR TO TEST- Trulicity (dulaglutide) Ozempic, Wegovy (semaglutide) Mounjaro (tirzepatide) Bydureon Bcise (exanatide extended release)  DO NOT TAKE 1 DAY PRIOR TO YOUR TEST Rybelsus (semaglutide) Adlyxin (lixisenatide) Victoza  (liraglutide) Byetta (exanatide) ___________________________________________________________________________  Due to recent changes in healthcare laws, you may see the results of your imaging and laboratory studies on MyChart before your provider has had a chance to review them.  We understand that in some cases there may be results that are confusing or concerning to you. Not all laboratory results come back in the same time frame and the provider may be waiting for multiple results in order to interpret others.  Please give us  48 hours in order for your provider to thoroughly review all the results before contacting the office for clarification of your results.   _______________________________________________________  If your blood pressure at your visit was 140/90 or greater, please contact your primary care physician to follow up on this.  _______________________________________________________  If you are age 72 or older, your body mass index should be between 23-30. Your Body mass index is 32.14 kg/m. If this is out of the aforementioned range listed, please consider follow up with your Primary Care Provider.  If you are age 72 or younger, your body mass index should be between 19-25. Your Body mass index is 32.14 kg/m. If this is out of the aformentioned range listed, please consider follow up with your Primary Care Provider.   ________________________________________________________  The [facility] providers would like to encourage you to use MYCHART to communicate with providers for non-urgent requests or questions.  Due to long hold times on the telephone, sending your provider a message by [facility] may be a faster and more efficient way to get a response.  Please allow 48 business hours for a response.  Please remember that this is for non-urgent requests.  _______________________________________________________  [facility] is using a team-based approach to care.  Your  team is made up of your doctor and two to three APPS. Our APPS (Nurse Practitioners and Physician Assistants) work with your physician to ensure care continuity for you. They are fully qualified to address your health concerns and develop a treatment plan. They communicate directly with your gastroenterologist to care for you. Seeing the Advanced Practice Practitioners on your physician's team can help you by facilitating care more promptly, often allowing for earlier appointments, access to diagnostic testing, procedures, and other specialty referrals.   Thank you for trusting me with your gastrointestinal care. Deanna May, FNP-C

## 2023-12-19 NOTE — Telephone Encounter (Signed)
   Patient Name: Frank Clark  DOB: 03/17/1951 MRN: [id number]  Primary Cardiologist: None  Clinical pharmacists have reviewed the patient's past medical history, labs, and current medications as part of preoperative protocol coverage. The following recommendations have been made:   Patient with diagnosis of atrial fibrillation on Eliquis  for anticoagulation.  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     endoscopic  When is this surgery scheduled?     01/03/24      CHA2DS2-VASc Score = 2   This indicates a 2.2% annual risk of stroke. The patient's score is based upon: CHF History: 0 HTN History: 1 Diabetes History: 0 Stroke History: 0 Vascular Disease History: 0 Age Score: 1 Gender Score: 0   CrCl 95 Platelet count 149   Patient has not had an Afib/aflutter ablation within the last 3 months or DCCV within the last 30 days.   Per office protocol, patient can hold Eliquis  for 1 days prior to procedure.   Patient will not need bridging with Lovenox (enoxaparin) around procedure. Please resume Eliquis  as soon as possible postprocedure, at the discretion of the surgeon.   I will route this recommendation to the requesting party via Epic fax function and remove from pre-op pool.  Please call with questions.  Damien JAYSON Braver, NP 12/19/2023, 12:31 PM

## 2023-12-19 NOTE — Telephone Encounter (Signed)
 Patient with diagnosis of atrial fibrillation on Eliquis  for anticoagulation.  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     endoscopic  When is this surgery scheduled?     01/03/24    CHA2DS2-VASc Score = 2   This indicates a 2.2% annual risk of stroke. The patient's score is based upon: CHF History: 0 HTN History: 1 Diabetes History: 0 Stroke History: 0 Vascular Disease History: 0 Age Score: 1 Gender Score: 0   CrCl 95 Platelet count 149  Patient has not had an Afib/aflutter ablation within the last 3 months or DCCV within the last 30 days.  Per office protocol, patient can hold Eliquis  for 1 days prior to procedure.   Patient will not need bridging with Lovenox (enoxaparin) around procedure.  **This guidance is not considered finalized until pre-operative APP has relayed final recommendations.**

## 2023-12-31 NOTE — Telephone Encounter (Signed)
Please advise on how to proceed. Thank you!   

## 2024-01-01 NOTE — Telephone Encounter (Signed)
 Patient is aware of Eliquis  hold. Patient verbalized understanding.

## 2024-01-03 ENCOUNTER — Encounter: Admitting: Gastroenterology

## 2024-01-13 ENCOUNTER — Ambulatory Visit (AMBULATORY_SURGERY_CENTER): Admitting: Gastroenterology

## 2024-01-13 ENCOUNTER — Encounter: Payer: Self-pay | Admitting: Gastroenterology

## 2024-01-13 VITALS — BP 114/76 | HR 60 | Temp 97.9°F | Resp 10 | Ht 71.0 in | Wt 232.0 lb

## 2024-01-13 DIAGNOSIS — K5730 Diverticulosis of large intestine without perforation or abscess without bleeding: Secondary | ICD-10-CM | POA: Diagnosis not present

## 2024-01-13 DIAGNOSIS — D128 Benign neoplasm of rectum: Secondary | ICD-10-CM

## 2024-01-13 DIAGNOSIS — K641 Second degree hemorrhoids: Secondary | ICD-10-CM

## 2024-01-13 DIAGNOSIS — L818 Other specified disorders of pigmentation: Secondary | ICD-10-CM

## 2024-01-13 DIAGNOSIS — I4891 Unspecified atrial fibrillation: Secondary | ICD-10-CM | POA: Diagnosis not present

## 2024-01-13 DIAGNOSIS — Z860100 Personal history of colon polyps, unspecified: Secondary | ICD-10-CM

## 2024-01-13 DIAGNOSIS — K621 Rectal polyp: Secondary | ICD-10-CM

## 2024-01-13 DIAGNOSIS — Z1211 Encounter for screening for malignant neoplasm of colon: Secondary | ICD-10-CM | POA: Diagnosis not present

## 2024-01-13 DIAGNOSIS — Z860101 Personal history of adenomatous and serrated colon polyps: Secondary | ICD-10-CM | POA: Diagnosis not present

## 2024-01-13 DIAGNOSIS — K648 Other hemorrhoids: Secondary | ICD-10-CM

## 2024-01-13 MED ORDER — SODIUM CHLORIDE 0.9 % IV SOLN: 500.0000 mL | INTRAVENOUS | Status: DC

## 2024-01-13 NOTE — Progress Notes (Signed)
 GASTROENTEROLOGY PROCEDURE H&P NOTE   Primary Care Physician: Keren Vicenta BRAVO, MD    Reason for Procedure:  Colon polyp surveillance  Plan:    Colonoscopy  Patient is appropriate for endoscopic procedure(s) in the ambulatory ([facility]) setting.  The nature of the procedure, as well as the risks, benefits, and alternatives were carefully and thoroughly reviewed with the patient. Ample time for discussion and questions allowed. The patient understood, was satisfied, and agreed to proceed.     HPI: Frank Clark is a 73 y.o. male who presents for colonoscopy for ongoing colon polyp surveillance and colon cancer screening.  No active GI symptoms.  No known family history of colon cancer or related malignancy.  Patient is otherwise without complaints or active issues today.  Last colonoscopy was 05/2019 and notable for three small 2-3 mm right sided sessile serrated polyps, 2 mm sigmoid hyperplastic polyp, 3 mm sigmoid hyperplastic polyp located within a diverticula (tattooed), 2 diminutive rectal hyperplastic polyps, pandiverticulosis, internal hemorrhoids, with recommendation to repeat in 3 years.  Last dose of Eliquis  was last evening.  Discussed with his Cardiologist who recommended only short 1 day hold due to history of A-fib, DVT x 2, factor V Leiden.  Discussed potential for increased risk of bleeding since the Eliquis  may not be completely washed out, and patient wishes to proceed as scheduled.  Past Medical History:  Diagnosis Date   Atrial fibrillation (HCC)    assoc with diverticulitis and RMSF   Colon polyps    Complication of anesthesia 02/24/2011   Hard to wake up after Gallbladder   Diverticulitis    DVT, lower extremity (HCC)    left   Factor V deficiency (HCC)    PVC (premature ventricular contraction)    RMSF [facility] spotted fever)    Shortness of breath    Ulcerative colitis (HCC)     Past Surgical History:  Procedure Laterality Date   ATRIAL  FIBRILLATION ABLATION N/A 03/13/2023   Procedure: ATRIAL FIBRILLATION ABLATION;  Surgeon: Nancey Eulas BRAVO, MD;  Location: [facility] INVASIVE CV LAB;  Service: Cardiovascular;  Laterality: N/A;   CHOLECYSTECTOMY     DG GALL BLADDER     INGUINAL HERNIA REPAIR Bilateral    LUMBAR LAMINECTOMY  05/08/2003   TEE WITHOUT CARDIOVERSION  04/23/2011   Procedure: TRANSESOPHAGEAL ECHOCARDIOGRAM (TEE);  Surgeon: Ezra Shuck, MD;  Location: [facility] ENDOSCOPY;  Service: Cardiovascular;  Laterality: N/A;   TONSILLECTOMY      Prior to Admission medications   Medication Sig Start Date End Date Taking? Authorizing Provider  apixaban  (ELIQUIS ) 5 MG TABS tablet Take 1 tablet (5 mg total) by mouth 2 (two) times daily. 05/29/18  Yes Fernande Elspeth BROCKS, MD  nebivolol  (BYSTOLIC ) 5 MG tablet Take 1 tablet (5 mg total) by mouth daily. 12/11/13  Yes Fernande Elspeth BROCKS, MD  pantoprazole  (PROTONIX ) 40 MG tablet Take 40 mg by mouth at bedtime. 05/11/19  Yes [provider]  Phenylephrine -Acetaminophen  (TYLENOL  SINUS+HEADACHE PO) Take 2 tablets by mouth as needed (sinus headaches).    [provider]  traMADol  (ULTRAM ) 50 MG tablet Take 100 mg by mouth as needed for moderate pain (pain score 4-6) or severe pain (pain score 7-10). 02/07/11   [provider]    Current Outpatient Medications  Medication Sig Dispense Refill   apixaban  (ELIQUIS ) 5 MG TABS tablet Take 1 tablet (5 mg total) by mouth 2 (two) times daily. 180 tablet 3   nebivolol  (BYSTOLIC ) 5 MG tablet Take 1 tablet (  5 mg total) by mouth daily. 90 tablet 1   pantoprazole  (PROTONIX ) 40 MG tablet Take 40 mg by mouth at bedtime.     Phenylephrine -Acetaminophen  (TYLENOL  SINUS+HEADACHE PO) Take 2 tablets by mouth as needed (sinus headaches).     traMADol  (ULTRAM ) 50 MG tablet Take 100 mg by mouth as needed for moderate pain (pain score 4-6) or severe pain (pain score 7-10).     Current Facility-Administered Medications  Medication Dose Route Frequency  Provider Last Rate Last Admin   0.9 %  sodium chloride  infusion  500 mL Intravenous Continuous Marlette Curvin V, DO        Allergies as of 01/13/2024 - Review Complete 01/13/2024  Allergen Reaction Noted   Sulfonamide derivatives Itching and Rash     Family History  Problem Relation Age of Onset   Stroke Father    Cancer Mother    Coronary artery disease Brother    Colon cancer Neg Hx     Social History   Socioeconomic History   Marital status: Married    Spouse name: Not on file   Number of children: Not on file   Years of education: Not on file   Highest education level: Not on file  Occupational History   Occupation: pastor  Tobacco Use   Smoking status: Former    Current packs/day: 0.00    Types: Cigarettes    Quit date: 1980    Years since quitting: 45.7   Smokeless tobacco: Never   Tobacco comments:    Former smoker 12/12/21  Vaping Use   Vaping status: Never Used  Substance and Sexual Activity   Alcohol use: No   Drug use: No   Sexual activity: Yes  Other Topics Concern   Not on file  Social History Narrative   Not on file   Social Drivers of Health   Financial Resource Strain: Not on file  Food Insecurity: Not on file  Transportation Needs: Not on file  Physical Activity: Not on file  Stress: Not on file  Social Connections: Not on file  Intimate Partner Violence: Not on file    Physical Exam: Vital signs in last 24 hours: @BP  139/72   Pulse (!) 59   Temp 97.9 F (36.6 C) (Temporal)   Ht 5' 11 (1.803 m)   Wt 232 lb (105.2 kg)   SpO2 97%   BMI 32.36 kg/m  GEN: NAD EYE: Sclerae anicteric ENT: MMM CV: Non-tachycardic Pulm: CTA b/l GI: Soft, NT/ND NEURO:  Alert & Oriented x 3   Sandor Flatter, DO [facility]   01/13/2024 3:27 PM

## 2024-01-13 NOTE — Op Note (Signed)
 [facility] Patient Name: Frank Clark Procedure Date: 01/13/2024 3:20 PM MRN: [id number] Endoscopist: Sandor Flatter , MD, [id number] Age: 73 Referring MD:  Date of Birth: [birth year] Gender: Male Account #: [id number] Procedure:                Colonoscopy Indications:              High risk colon cancer surveillance: Personal                            history of sessile serrated colon polyp (less than                            10 mm in size) with no dysplasia                           Last colonoscopy was 05/2019 and notable for three                            small 2-3 mm right sided sessile serrated polyps, 2                            mm sigmoid hyperplastic polyp, 3 mm sigmoid                            hyperplastic polyp located within a diverticula                            (tattoo placed), 2 diminutive rectal hyperplastic                            polyps, pandiverticulosis, internal hemorrhoids,                            with recommendation to repeat in 3 years. Medicines:                Monitored Anesthesia Care Procedure:                Pre-Anesthesia Assessment:                           - Prior to the procedure, a History and Physical                            was performed, and patient medications and                            allergies were reviewed. The patient's tolerance of                            previous anesthesia was also reviewed. The risks                            and benefits of the procedure and the sedation  options and risks were discussed with the patient.                            All questions were answered, and informed consent                            was obtained. Prior Anticoagulants: The patient has                            taken Eliquis  (apixaban ), last dose was 1 day prior                            to procedure. ASA Grade Assessment: III - A patient                            with severe  systemic disease. After reviewing the                            risks and benefits, the patient was deemed in                            satisfactory condition to undergo the procedure.                           After obtaining informed consent, the colonoscope                            was passed under direct vision. Throughout the                            procedure, the patient's blood pressure, pulse, and                            oxygen saturations were monitored continuously. The                            CF [id number] #[id number] was introduced through the anus                            and advanced to the the cecum, identified by                            appendiceal orifice and ileocecal valve. The                            colonoscopy was performed without difficulty. The                            patient tolerated the procedure well. The quality                            of the bowel preparation was good. The ileocecal  valve, appendiceal orifice, and rectum were                            photographed. Scope In: 3:35:30 PM Scope Out: 3:47:44 PM Scope Withdrawal Time: 0 hours 9 minutes 48 seconds  Total Procedure Duration: 0 hours 12 minutes 14 seconds  Findings:                 The perianal and digital rectal examinations were                            normal.                           A 3 mm polyp was found in the rectum. The polyp was                            sessile. The polyp was removed with a cold snare.                            Resection and retrieval were complete. Estimated                            blood loss was minimal.                           A tattoo was seen in the sigmoid colon. The tattoo                            site and surrounding mucosa all appeared normal.                            Carefully evaluated each of the diverticula located                            near the tattoo and no polypoid tissue noted.                            Multiple large-mouthed and small-mouthed                            diverticula were found in the sigmoid colon,                            descending colon, transverse colon, ascending colon                            and cecum.                           Non-bleeding internal hemorrhoids were found during                            retroflexion. The hemorrhoids were small. Complications:            No immediate complications. Estimated Blood Loss:  Estimated blood loss was minimal. Impression:               - One 3 mm polyp in the rectum, removed with a cold                            snare. Resected and retrieved.                           - A tattoo was seen in the sigmoid colon. The                            tattoo site appeared normal.                           - Diverticulosis in the sigmoid colon, in the                            descending colon, in the transverse colon, in the                            ascending colon and in the cecum.                           - Non-bleeding internal hemorrhoids. Recommendation:           - Patient has a contact number available for                            emergencies. The signs and symptoms of potential                            delayed complications were discussed with the                            patient. Return to normal activities tomorrow.                            Written discharge instructions were provided to the                            patient.                           - Resume previous diet.                           - Continue present medications.                           - Await pathology results.                           - Repeat colonoscopy in 5 years for surveillance,                            or sooner if needed based on  pathology results.                           - Return to GI clinic PRN.                           - Resume Eliquis  (apixaban ) at prior dose tomorrow. Sandor Flatter,  MD 01/13/2024 3:56:15 PM

## 2024-01-13 NOTE — Patient Instructions (Addendum)
 Resume previous diet and medications. Awaiting pathology results. Repeat Colonoscopy date to be determined based on pathology results. Restart Eliquis  at previous dose tomorrow. Handouts provided on Colon polyps, Diverticulosis and Hemorrhoids  YOU HAD AN ENDOSCOPIC PROCEDURE TODAY AT THE [facility]:   Refer to the procedure report that was given to you for any specific questions about what was found during the examination.  If the procedure report does not answer your questions, please call your gastroenterologist to clarify.  If you requested that your care partner not be given the details of your procedure findings, then the procedure report has been included in a sealed envelope for you to review at your convenience later.  YOU SHOULD EXPECT: Some feelings of bloating in the abdomen. Passage of more gas than usual.  Walking can help get rid of the air that was put into your GI tract during the procedure and reduce the bloating. If you had a lower endoscopy (such as a colonoscopy or flexible sigmoidoscopy) you may notice spotting of blood in your stool or on the toilet paper. If you underwent a bowel prep for your procedure, you may not have a normal bowel movement for a few days.  Please Note:  You might notice some irritation and congestion in your nose or some drainage.  This is from the oxygen used during your procedure.  There is no need for concern and it should clear up in a day or so.  SYMPTOMS TO REPORT IMMEDIATELY:  Following lower endoscopy (colonoscopy or flexible sigmoidoscopy):  Excessive amounts of blood in the stool  Significant tenderness or worsening of abdominal pains  Swelling of the abdomen that is new, acute  Fever of 100F or higher  For urgent or emergent issues, a gastroenterologist can be reached at any hour by calling [phone]. Do not use MyChart messaging for urgent concerns.    DIET:  We do recommend a small meal at first, but then you may  proceed to your regular diet.  Drink plenty of fluids but you should avoid alcoholic beverages for 24 hours.  ACTIVITY:  You should plan to take it easy for the rest of today and you should NOT DRIVE or use heavy machinery until tomorrow (because of the sedation medicines used during the test).    FOLLOW UP: Our staff will call the number listed on your records the next business day following your procedure.  We will call around 7:15- 8:00 am to check on you and address any questions or concerns that you may have regarding the information given to you following your procedure. If we do not reach you, we will leave a message.     If any biopsies were taken you will be contacted by phone or by letter within the next 1-3 weeks.  Please call us  at [phone] if you have not heard about the biopsies in 3 weeks.    SIGNATURES/CONFIDENTIALITY: You and/or your care partner have signed paperwork which will be entered into your electronic medical record.  These signatures attest to the fact that that the information above on your After Visit Summary has been reviewed and is understood.  Full responsibility of the confidentiality of this discharge information lies with you and/or your care-partner.

## 2024-01-13 NOTE — Progress Notes (Signed)
 Pt's states no medical or surgical changes since previsit or office visit.

## 2024-01-13 NOTE — Progress Notes (Signed)
 Called to room to assist during endoscopic procedure.  Patient ID and intended procedure confirmed with present staff. Received instructions for my participation in the procedure from the performing physician.

## 2024-01-13 NOTE — Progress Notes (Signed)
 Sedate, gd SR, tolerated procedure well, VSS, report to RN

## 2024-01-14 ENCOUNTER — Telehealth: Payer: Self-pay

## 2024-01-14 NOTE — Telephone Encounter (Signed)
  Follow up Call-     01/13/2024    2:40 PM  Call back number  Post procedure Call Back phone  # [phone]  Permission to leave phone message Yes     Patient questions:  Do you have a fever, pain , or abdominal swelling? No. Pain Score  0 *  Have you tolerated food without any problems? Yes.    Have you been able to return to your normal activities? Yes.    Do you have any questions about your discharge instructions: Diet   No. Medications  No. Follow up visit  No.  Do you have questions or concerns about your Care? No.  Actions: * If pain score is 4 or above: No action needed, pain <4.  Male answered follow up call for this patient. Attempted to ask name and follow up questions and male stated he is doing okay, he is doing fine, bye and proceeded to hang up call.

## 2024-01-16 ENCOUNTER — Ambulatory Visit: Payer: Self-pay | Admitting: Gastroenterology

## 2024-01-16 LAB — SURGICAL PATHOLOGY

## 2024-03-02 DIAGNOSIS — M51379 Other intervertebral disc degeneration, lumbosacral region without mention of lumbar back pain or lower extremity pain: Secondary | ICD-10-CM | POA: Diagnosis not present

## 2024-03-02 DIAGNOSIS — Z23 Encounter for immunization: Secondary | ICD-10-CM | POA: Diagnosis not present

## 2024-03-02 DIAGNOSIS — B351 Tinea unguium: Secondary | ICD-10-CM | POA: Diagnosis not present

## 2024-03-02 DIAGNOSIS — G5793 Unspecified mononeuropathy of bilateral lower limbs: Secondary | ICD-10-CM | POA: Diagnosis not present

## 2024-03-25 DIAGNOSIS — E785 Hyperlipidemia, unspecified: Secondary | ICD-10-CM | POA: Diagnosis not present

## 2024-03-25 DIAGNOSIS — D682 Hereditary deficiency of other clotting factors: Secondary | ICD-10-CM | POA: Diagnosis not present

## 2024-03-25 DIAGNOSIS — Z79899 Other long term (current) drug therapy: Secondary | ICD-10-CM | POA: Diagnosis not present

## 2024-03-25 DIAGNOSIS — B351 Tinea unguium: Secondary | ICD-10-CM | POA: Diagnosis not present

## 2024-03-25 DIAGNOSIS — K219 Gastro-esophageal reflux disease without esophagitis: Secondary | ICD-10-CM | POA: Diagnosis not present

## 2024-03-25 DIAGNOSIS — Z125 Encounter for screening for malignant neoplasm of prostate: Secondary | ICD-10-CM | POA: Diagnosis not present

## 2024-03-25 DIAGNOSIS — R7301 Impaired fasting glucose: Secondary | ICD-10-CM | POA: Diagnosis not present

## 2024-03-25 DIAGNOSIS — M51379 Other intervertebral disc degeneration, lumbosacral region without mention of lumbar back pain or lower extremity pain: Secondary | ICD-10-CM | POA: Diagnosis not present

## 2024-03-25 DIAGNOSIS — I480 Paroxysmal atrial fibrillation: Secondary | ICD-10-CM | POA: Diagnosis not present

## 2024-03-31 DIAGNOSIS — M5432 Sciatica, left side: Secondary | ICD-10-CM | POA: Diagnosis not present

## 2024-03-31 DIAGNOSIS — M5431 Sciatica, right side: Secondary | ICD-10-CM | POA: Diagnosis not present

## 2024-03-31 DIAGNOSIS — Z1330 Encounter for screening examination for mental health and behavioral disorders, unspecified: Secondary | ICD-10-CM | POA: Diagnosis not present

## 2024-03-31 DIAGNOSIS — M48062 Spinal stenosis, lumbar region with neurogenic claudication: Secondary | ICD-10-CM | POA: Diagnosis not present

## 2024-04-09 DIAGNOSIS — M722 Plantar fascial fibromatosis: Secondary | ICD-10-CM | POA: Diagnosis not present

## 2024-04-09 DIAGNOSIS — M6702 Short Achilles tendon (acquired), left ankle: Secondary | ICD-10-CM | POA: Diagnosis not present

## 2024-04-09 DIAGNOSIS — M6701 Short Achilles tendon (acquired), right ankle: Secondary | ICD-10-CM | POA: Diagnosis not present

## 2024-05-13 ENCOUNTER — Ambulatory Visit: Admitting: Cardiovascular Disease

## 2024-05-13 ENCOUNTER — Encounter: Payer: Self-pay | Admitting: Cardiovascular Disease

## 2024-05-13 VITALS — BP 127/73 | HR 70 | Resp 16 | Ht 71.0 in | Wt 234.2 lb

## 2024-05-13 DIAGNOSIS — I480 Paroxysmal atrial fibrillation: Secondary | ICD-10-CM | POA: Diagnosis present

## 2024-05-13 NOTE — Progress Notes (Signed)
" °  Electrophysiology Office Note:    Date:  05/13/2024   ID:  Frank Clark, DOB October 15, 1950, MRN [id number]  PCP:  Keren Vicenta BRAVO, MD   [facility] Providers Cardiologist:  None Electrophysiologist:  Eulas BRAVO Furbish, MD     Referring MD: Keren Vicenta BRAVO, MD   History of Present Illness:    Frank Clark is a 74 y.o. male with a medical history significant for paroxysmal atrial fibrillation, factor V Leiden deficiency (2 DVTs), hypertension, referred for AF management.     He was diagnosed with atrial fibrillation years ago.  In the past episodes were triggered by bronchitis and steroids.  He has not had palpitations, but he does feel severely fatigued.    He has been noticing increasing frequency of atrial fibrillation.  He was referred for A-fib ablation and started on flecainide  as a temporizing measure.   His wife is a engineer, civil (consulting).  He underwent ablation for paroxysmal atrial fibrillation in November 2024 with pulmonary vein ablation with pulsed field energy.  He reports that he has not had any symptoms of A-fib recurrence since the ablation.  He had an epidural injection yesterday, which in the past is oftentimes triggered atrial fibrillation, and remains in sinus rhythm today     Reports that he is doing very well today.   EKGs/Labs/Other Studies Reviewed Today:    Echocardiogram:  TTE October 10, 2022 EF 55-60%; normal atrial sizes   Monitors:  Zio Monitor 10/2020 - my interpretation Predominantly sinus rhythm HR 47-167, avg 71 AF burden 5%: HR 47-146, avg 85  Episodes of SVT occurred consistent with atrial tachycardia PVC burden 6.4%  Stress testing:  ETT 11/13/2022 10 mets achieved. No evidence of ischemia  Advanced imaging:   Cardiac catherization   EKG:   EKG Interpretation Date/Time:  Wednesday May 13 2024 09:52:24 EST Ventricular Rate:  78 PR Interval:  164 QRS Duration:  92 QT Interval:  384 QTC Calculation: 437 R  Axis:   -45  Text Interpretation: Sinus rhythm with PACs Left axis deviation Inferior infarct (cited on or before 13-May-2024) When compared with ECG of 13-Jun-2023 11:13, No significant change was found Confirmed by Furbish Eulas [phone]) on 05/13/2024 10:33:38 AM     Physical Exam:    VS:  BP 127/73 (BP Location: Left Arm, Patient Position: Sitting, Cuff Size: Large)   Pulse 70   Resp 16   Ht 5' 11 (1.803 m)   Wt 234 lb 3.2 oz (106.2 kg)   SpO2 97%   BMI 32.66 kg/m     Wt Readings from Last 3 Encounters:  05/13/24 234 lb 3.2 oz (106.2 kg)  01/13/24 232 lb (105.2 kg)  12/11/23 232 lb 2 oz (105.3 kg)     GEN: Well nourished, well developed in no acute distress CARDIAC: RRR, no murmurs, rubs, gallops RESPIRATORY:  Normal work of breathing MUSCULOSKELETAL: no edema    ASSESSMENT & PLAN:    Atrial fibrillation ECG 4/24 shows 5% burden of AF with rates controlled on average He is symptomatic with fatigue and neck discomfort Maintaining sinus rhythm since ablation in November 2025 I encouraged him to make an effort at weight loss  Secondary hypercoagulable state Continue apixaban  5 mg twice daily  PVCs Burden low at 6.4%     Signed, Eulas BRAVO Furbish, MD  05/13/2024 10:33 AM    [facility] "

## 2024-05-13 NOTE — Patient Instructions (Signed)
 Medication Instructions:  Your physician recommends that you continue on your current medications as directed. Please refer to the Current Medication list given to you today.  *If you need a refill on your cardiac medications before your next appointment, please call your pharmacy*  Lab Work: None ordered.  If you have labs (blood work) drawn today and your tests are completely normal, you will receive your results only by: MyChart Message (if you have MyChart) OR A paper copy in the mail If you have any lab test that is abnormal or we need to change your treatment, we will call you to review the results.  Testing/Procedures: None ordered.   Follow-Up: At [facility], you and your health needs are our priority.  As part of our continuing mission to provide you with exceptional heart care, our providers are all part of one team.  This team includes your primary Cardiologist (physician) and Advanced Practice Providers or APPs (Physician Assistants and Nurse Practitioners) who all work together to provide you with the care you need, when you need it.  Your next appointment:   Afib Clinic - 6 months

## 2024-05-14 ENCOUNTER — Encounter (HOSPITAL_COMMUNITY): Payer: Self-pay

## 2024-11-16 ENCOUNTER — Ambulatory Visit (HOSPITAL_COMMUNITY): Admitting: Physician Assistant
